# Patient Record
Sex: Female | Born: 1970 | Race: White | Hispanic: No | Marital: Single | Smoking: Never smoker
Health system: Southern US, Community
[De-identification: ages and names within clinical notes are randomized; demographics above are authoritative.]

## PROBLEM LIST (undated history)

## (undated) DIAGNOSIS — F329 Major depressive disorder, single episode, unspecified: Secondary | ICD-10-CM

## (undated) DIAGNOSIS — F419 Anxiety disorder, unspecified: Secondary | ICD-10-CM

## (undated) DIAGNOSIS — R102 Pelvic and perineal pain: Secondary | ICD-10-CM

## (undated) DIAGNOSIS — F32A Depression, unspecified: Secondary | ICD-10-CM

## (undated) HISTORY — PX: BREAST ENHANCEMENT SURGERY: SHX7

## (undated) HISTORY — PX: NASAL SINUS SURGERY: SHX719

## (undated) HISTORY — PX: ABDOMINAL HYSTERECTOMY: SHX81

---

## 2005-05-27 ENCOUNTER — Emergency Department: Payer: Self-pay | Admitting: Emergency Medicine

## 2005-11-06 HISTORY — PX: TUBAL LIGATION: SHX77

## 2008-08-17 ENCOUNTER — Ambulatory Visit: Payer: Self-pay | Admitting: Family Medicine

## 2009-11-13 ENCOUNTER — Ambulatory Visit: Payer: Self-pay | Admitting: Family Medicine

## 2009-11-21 ENCOUNTER — Ambulatory Visit: Payer: Self-pay | Admitting: Internal Medicine

## 2010-06-02 ENCOUNTER — Ambulatory Visit: Payer: Self-pay | Admitting: Internal Medicine

## 2010-08-25 ENCOUNTER — Emergency Department: Payer: Self-pay | Admitting: Emergency Medicine

## 2010-09-14 ENCOUNTER — Ambulatory Visit: Payer: Self-pay | Admitting: Family Medicine

## 2011-01-02 ENCOUNTER — Ambulatory Visit: Payer: Self-pay | Admitting: Internal Medicine

## 2011-12-28 ENCOUNTER — Ambulatory Visit: Payer: Self-pay | Admitting: Family Medicine

## 2012-01-02 ENCOUNTER — Ambulatory Visit: Payer: Self-pay | Admitting: Internal Medicine

## 2012-05-01 ENCOUNTER — Ambulatory Visit: Payer: Self-pay | Admitting: Family Medicine

## 2012-05-01 LAB — URINALYSIS, COMPLETE
Bilirubin,UR: NEGATIVE
Glucose,UR: NEGATIVE mg/dL (ref 0–75)
Nitrite: NEGATIVE
Protein: NEGATIVE

## 2012-05-01 LAB — PREGNANCY, URINE: Pregnancy Test, Urine: NEGATIVE m[IU]/mL

## 2012-05-02 LAB — URINE CULTURE

## 2012-07-21 ENCOUNTER — Ambulatory Visit: Payer: Self-pay

## 2013-06-24 ENCOUNTER — Ambulatory Visit: Payer: Self-pay | Admitting: Emergency Medicine

## 2013-06-24 LAB — URINALYSIS, COMPLETE
Glucose,UR: NEGATIVE mg/dL (ref 0–75)
Ketone: NEGATIVE
Nitrite: NEGATIVE
Specific Gravity: 1.02 (ref 1.003–1.030)

## 2013-09-16 ENCOUNTER — Ambulatory Visit: Payer: Self-pay | Admitting: Emergency Medicine

## 2013-09-16 LAB — URINALYSIS, COMPLETE

## 2013-09-18 LAB — URINE CULTURE

## 2013-11-21 ENCOUNTER — Encounter (HOSPITAL_BASED_OUTPATIENT_CLINIC_OR_DEPARTMENT_OTHER): Payer: Self-pay | Admitting: *Deleted

## 2013-11-21 NOTE — Progress Notes (Signed)
SPOKE W/ PT HUSBAND. ASK PT MED. ALLERGY REACTION, HUSBAND DID NOT KNOW AND IF TAKING MEDS, HE SAID SHE DID NOT. NPO AFTER MN. PRE-OP ORDERS PENDING. NEEDS HG.

## 2013-11-23 NOTE — H&P (Signed)
Keani Pupo  DICTATIOLorelle Gibbs # [id number] CSN# [id number]   Meriel PicaHOLLAND,Moe Brier M, MD 11/23/2013 9:32 AM

## 2013-11-24 ENCOUNTER — Encounter (HOSPITAL_COMMUNITY): Payer: Self-pay | Admitting: Pharmacist

## 2013-11-24 ENCOUNTER — Ambulatory Visit (HOSPITAL_BASED_OUTPATIENT_CLINIC_OR_DEPARTMENT_OTHER)
Admission: RE | Admit: 2013-11-24 | Discharge: 2013-11-24 | Disposition: A | Discharge status: Home / Self Care | Payer: Managed Care, Other (non HMO) | Source: Ambulatory Visit | Attending: Obstetrics and Gynecology | Admitting: Obstetrics and Gynecology

## 2013-11-24 ENCOUNTER — Encounter (HOSPITAL_BASED_OUTPATIENT_CLINIC_OR_DEPARTMENT_OTHER)
Admission: RE | Disposition: A | Discharge status: Home / Self Care | Payer: Self-pay | Source: Ambulatory Visit | Attending: Obstetrics and Gynecology

## 2013-11-24 ENCOUNTER — Ambulatory Visit (HOSPITAL_BASED_OUTPATIENT_CLINIC_OR_DEPARTMENT_OTHER): Payer: Managed Care, Other (non HMO) | Admitting: Anesthesiology

## 2013-11-24 ENCOUNTER — Encounter (HOSPITAL_BASED_OUTPATIENT_CLINIC_OR_DEPARTMENT_OTHER): Payer: Self-pay

## 2013-11-24 ENCOUNTER — Encounter (HOSPITAL_BASED_OUTPATIENT_CLINIC_OR_DEPARTMENT_OTHER): Payer: Managed Care, Other (non HMO) | Admitting: Anesthesiology

## 2013-11-24 DIAGNOSIS — N83209 Unspecified ovarian cyst, unspecified side: Secondary | ICD-10-CM | POA: Insufficient documentation

## 2013-11-24 DIAGNOSIS — N949 Unspecified condition associated with female genital organs and menstrual cycle: Secondary | ICD-10-CM | POA: Insufficient documentation

## 2013-11-24 DIAGNOSIS — N808 Other endometriosis: Secondary | ICD-10-CM | POA: Insufficient documentation

## 2013-11-24 DIAGNOSIS — N801 Endometriosis of ovary: Secondary | ICD-10-CM | POA: Insufficient documentation

## 2013-11-24 DIAGNOSIS — N80109 Endometriosis of ovary, unspecified side, unspecified depth: Secondary | ICD-10-CM | POA: Insufficient documentation

## 2013-11-24 DIAGNOSIS — N920 Excessive and frequent menstruation with regular cycle: Secondary | ICD-10-CM | POA: Insufficient documentation

## 2013-11-24 HISTORY — DX: Pelvic and perineal pain: R10.2

## 2013-11-24 HISTORY — PX: LAPAROSCOPY: SHX197

## 2013-11-24 LAB — POCT HEMOGLOBIN-HEMACUE: Hemoglobin: 13.2 g/dL (ref 12.0–15.0)

## 2013-11-24 SURGERY — LAPAROSCOPY, DIAGNOSTIC
Anesthesia: General | Site: Abdomen

## 2013-11-24 MED ORDER — OXYCODONE-ACETAMINOPHEN 5-325 MG PO TABS
1.0000 | ORAL_TABLET | ORAL | Status: DC | PRN
  Administered 2013-11-24: 1 via ORAL
  Filled 2013-11-24 (×2): qty 1

## 2013-11-24 MED ORDER — LIDOCAINE HCL (CARDIAC) 20 MG/ML IV SOLN
INTRAVENOUS | Status: DC | PRN
  Administered 2013-11-24: 80 mg via INTRAVENOUS

## 2013-11-24 MED ORDER — PROPOFOL 10 MG/ML IV BOLUS
INTRAVENOUS | Status: DC | PRN
  Administered 2013-11-24: 200 mg via INTRAVENOUS

## 2013-11-24 MED ORDER — ROCURONIUM BROMIDE 100 MG/10ML IV SOLN
INTRAVENOUS | Status: DC | PRN
  Administered 2013-11-24: 30 mg via INTRAVENOUS

## 2013-11-24 MED ORDER — MEPERIDINE HCL 25 MG/ML IJ SOLN
6.2500 mg | INTRAMUSCULAR | Status: DC | PRN
  Filled 2013-11-24: qty 1

## 2013-11-24 MED ORDER — LACTATED RINGERS IV SOLN
INTRAVENOUS | Status: DC
  Administered 2013-11-24: 07:00:00 via INTRAVENOUS
  Filled 2013-11-24: qty 1000

## 2013-11-24 MED ORDER — PROMETHAZINE HCL 25 MG/ML IJ SOLN
6.2500 mg | INTRAMUSCULAR | Status: DC | PRN
  Filled 2013-11-24: qty 1

## 2013-11-24 MED ORDER — HYDROMORPHONE HCL PF 1 MG/ML IJ SOLN
0.2500 mg | INTRAMUSCULAR | Status: DC | PRN
  Filled 2013-11-24: qty 1

## 2013-11-24 MED ORDER — FENTANYL CITRATE 0.05 MG/ML IJ SOLN
INTRAMUSCULAR | Status: DC | PRN
  Administered 2013-11-24: 25 ug via INTRAVENOUS
  Administered 2013-11-24: 50 ug via INTRAVENOUS
  Administered 2013-11-24: 25 ug via INTRAVENOUS
  Administered 2013-11-24: 50 ug via INTRAVENOUS
  Administered 2013-11-24 (×2): 25 ug via INTRAVENOUS

## 2013-11-24 MED ORDER — KETOROLAC TROMETHAMINE 30 MG/ML IJ SOLN
INTRAMUSCULAR | Status: DC | PRN
  Administered 2013-11-24: 30 mg via INTRAVENOUS

## 2013-11-24 MED ORDER — MIDAZOLAM HCL 2 MG/2ML IJ SOLN
INTRAMUSCULAR | Status: AC
  Filled 2013-11-24: qty 2

## 2013-11-24 MED ORDER — BUPIVACAINE HCL (PF) 0.25 % IJ SOLN
INTRAMUSCULAR | Status: DC | PRN
  Administered 2013-11-24: 6 mL

## 2013-11-24 MED ORDER — ONDANSETRON HCL 4 MG/2ML IJ SOLN
INTRAMUSCULAR | Status: DC | PRN
  Administered 2013-11-24: 4 mg via INTRAVENOUS

## 2013-11-24 MED ORDER — MIDAZOLAM HCL 5 MG/5ML IJ SOLN
INTRAMUSCULAR | Status: DC | PRN
  Administered 2013-11-24 (×2): 1 mg via INTRAVENOUS

## 2013-11-24 MED ORDER — GLYCOPYRROLATE 0.2 MG/ML IJ SOLN
INTRAMUSCULAR | Status: DC | PRN
  Administered 2013-11-24: 0.6 mg via INTRAVENOUS

## 2013-11-24 MED ORDER — NEOSTIGMINE METHYLSULFATE 1 MG/ML IJ SOLN
INTRAMUSCULAR | Status: DC | PRN
  Administered 2013-11-24: 4 mg via INTRAVENOUS

## 2013-11-24 MED ORDER — FENTANYL CITRATE 0.05 MG/ML IJ SOLN
INTRAMUSCULAR | Status: AC
  Filled 2013-11-24: qty 6

## 2013-11-24 MED ORDER — LACTATED RINGERS IV SOLN
INTRAVENOUS | Status: DC | PRN
  Administered 2013-11-24 (×2): via INTRAVENOUS

## 2013-11-24 MED ORDER — DEXAMETHASONE SODIUM PHOSPHATE 4 MG/ML IJ SOLN
INTRAMUSCULAR | Status: DC | PRN
  Administered 2013-11-24: 10 mg via INTRAVENOUS

## 2013-11-24 SURGICAL SUPPLY — 64 items
APPLICATOR COTTON TIP 6IN STRL (MISCELLANEOUS) ×3 IMPLANT
BAG URINE DRAINAGE (UROLOGICAL SUPPLIES) IMPLANT
BANDAGE ADHESIVE 1X3 (GAUZE/BANDAGES/DRESSINGS) ×3 IMPLANT
BLADE SURG 11 STRL SS (BLADE) ×3 IMPLANT
BLADE SURG ROTATE 9660 (MISCELLANEOUS) IMPLANT
CANISTER SUCTION 2500CC (MISCELLANEOUS) IMPLANT
CATH FOLEY 2WAY SLVR  5CC 16FR (CATHETERS)
CATH FOLEY 2WAY SLVR 5CC 16FR (CATHETERS) IMPLANT
CATH ROBINSON RED A/P 16FR (CATHETERS) IMPLANT
CLOSURE WOUND 1/4X4 (GAUZE/BANDAGES/DRESSINGS)
CLOTH BEACON ORANGE TIMEOUT ST (SAFETY) ×3 IMPLANT
DERMABOND ADVANCED (GAUZE/BANDAGES/DRESSINGS) ×2
DERMABOND ADVANCED .7 DNX12 (GAUZE/BANDAGES/DRESSINGS) ×1 IMPLANT
DRAPE CAMERA CLOSED 9X96 (DRAPES) ×3 IMPLANT
DRAPE UNDERBUTTOCKS STRL (DRAPE) ×3 IMPLANT
DRESSING TELFA 8X3 (GAUZE/BANDAGES/DRESSINGS) IMPLANT
ELECT REM PT RETURN 9FT ADLT (ELECTROSURGICAL) ×3
ELECTRODE REM PT RTRN 9FT ADLT (ELECTROSURGICAL) ×1 IMPLANT
FILTER SMOKE EVAC LAPAROSHD (FILTER) IMPLANT
GLOVE BIO SURGEON STRL SZ7 (GLOVE) ×6 IMPLANT
GLOVE BIOGEL M 6.5 STRL (GLOVE) ×12 IMPLANT
GLOVE BIOGEL M STER SZ 6 (GLOVE) ×3 IMPLANT
GLOVE BIOGEL PI IND STRL 6.5 (GLOVE) ×1 IMPLANT
GLOVE BIOGEL PI INDICATOR 6.5 (GLOVE) ×2
GOWN PREVENTION PLUS LG XLONG (DISPOSABLE) IMPLANT
GOWN STRL REUS W/TWL LRG LVL3 (GOWN DISPOSABLE) ×9 IMPLANT
GOWN STRL REUS W/TWL XL LVL3 (GOWN DISPOSABLE) ×3 IMPLANT
IV LACTATED RINGERS 1000ML (IV SOLUTION) IMPLANT
NEEDLE HYPO 25X1 1.5 SAFETY (NEEDLE) IMPLANT
NEEDLE INSUFFLATION 14GA 120MM (NEEDLE) ×3 IMPLANT
NEEDLE INSUFFLATION 14GA 150MM (NEEDLE) IMPLANT
NS IRRIG 500ML POUR BTL (IV SOLUTION) ×3 IMPLANT
PACK BASIN DAY SURGERY FS (CUSTOM PROCEDURE TRAY) ×3 IMPLANT
PACK LAPAROSCOPY II (CUSTOM PROCEDURE TRAY) ×3 IMPLANT
PAD OB MATERNITY 4.3X12.25 (PERSONAL CARE ITEMS) ×3 IMPLANT
PAD PREP 24X48 CUFFED NSTRL (MISCELLANEOUS) ×3 IMPLANT
PENCIL BUTTON HOLSTER BLD 10FT (ELECTRODE) IMPLANT
POUCH SPECIMEN RETRIEVAL 10MM (ENDOMECHANICALS) IMPLANT
SCALPEL HARMONIC ACE (MISCELLANEOUS) IMPLANT
SCISSORS LAP 5X35 DISP (ENDOMECHANICALS) IMPLANT
SEALER TISSUE G2 CVD JAW 35 (ENDOMECHANICALS) IMPLANT
SEALER TISSUE G2 CVD JAW 45CM (ENDOMECHANICALS)
SET IRRIG TUBING LAPAROSCOPIC (IRRIGATION / IRRIGATOR) IMPLANT
SOLUTION ANTI FOG 6CC (MISCELLANEOUS) ×3 IMPLANT
SPONGE GAUZE 4X4 12PLY (GAUZE/BANDAGES/DRESSINGS) ×3 IMPLANT
STRIP CLOSURE SKIN 1/4X4 (GAUZE/BANDAGES/DRESSINGS) IMPLANT
SUT MNCRL AB 3-0 PS2 18 (SUTURE) ×3 IMPLANT
SUT MON AB 2-0 CT1 36 (SUTURE) IMPLANT
SUT PLAIN 4 0 FS 2 27 (SUTURE) IMPLANT
SUT VICRYL 0 TIES 12 18 (SUTURE) IMPLANT
SUT VICRYL 0 UR6 27IN ABS (SUTURE) IMPLANT
SUT VICRYL RAPIDE 3 0 (SUTURE) ×3 IMPLANT
SUT VICRYL RAPIDE 4/0 PS 2 (SUTURE) ×3 IMPLANT
SYR 3ML 23GX1 SAFETY (SYRINGE) IMPLANT
SYR CONTROL 10ML LL (SYRINGE) IMPLANT
SYRINGE 10CC LL (SYRINGE) ×3 IMPLANT
TOWEL OR 17X24 6PK STRL BLUE (TOWEL DISPOSABLE) ×6 IMPLANT
TRAY DSU PREP LF (CUSTOM PROCEDURE TRAY) ×3 IMPLANT
TROCAR OPTI TIP 5M 100M (ENDOMECHANICALS) ×6 IMPLANT
TROCAR XCEL BLUNT TIP 100MML (ENDOMECHANICALS) IMPLANT
TROCAR XCEL DIL TIP R 11M (ENDOMECHANICALS) ×3 IMPLANT
TUBING INSUFFLATION W/FILTER (TUBING) ×3 IMPLANT
VACUUM HOSE/TUBING 7/8INX6FT (MISCELLANEOUS) IMPLANT
WATER STERILE IRR 500ML POUR (IV SOLUTION) ×3 IMPLANT

## 2013-11-24 NOTE — Op Note (Signed)
Preoperative diagnosis: Pelvic pain, menorrhagia  Postoperative diagnosis: Same, plus pelvic endometriosis  Procedure: Diagnostic laparoscopy, lysis of adhesions  Surgeon: Monica Osborne  Anesthesia: Gen.  EBL: Less than 5 cc  Procedure and findings:  The patient taken the operating room after an adequate level of general anesthesia was obtained with the patient's legs in stirrups, the abdomen perineum and vagina were prepped and draped in the usual fashion. The bladder was drained. Appropriate timeout was taken at that point. EUA was carried out the uterus is normal size, mobile, adnexa negative. Hulka tenaculum was positioned.  Attention directed to the abdomen, the subumbilical area was infiltrated with quarter percent Marcaine plain, small incision was made in the varies needle was introduced without difficulty. Its intra-abdominal position was verified by pressure water testing. After 2 L pneumoperitoneum syncopated lap scopic trocar and sleeve were then introduced without difficulty. Initial inspection revealed no evidence of any bleeding or trauma, 3 finger breaths above the symphysis in the midline, a 5 mm trocar was inserted under direct visualization. The patient was then placed in Trendelenburg and the pelvic findings as follows  Uterus itself was normal size, the anterior cul-de-sac spaces free and clear there were several superficial implants of endometriosis noted on the surface of the right ovary, on the left side there was a very small 1 cm clear wall cyst and several periadnexal adhesions which were lysed in an avascular plane to allow elevation of the left ovary. This did show some minimal scarring along the left pelvic sidewall consistent with endometriosis. In the cul-de-sac there was a peritoneal window lateral to the right uterosacral ligament and again consistent with pelvic endometriosis. No other upper abdominal abnormalities were noted these findings for photo documented. Its was  removed, gas allowed to escape because closed with 4-0 Vicryl subcuticular suture and Dermabond. Prior to closure sponge, needle, instrument counts reported as correct x2. She tolerated this well went to recovery in good condition.  Dictated with [facility]  Monica Osborne M. Monica Osborne M.D.

## 2013-11-24 NOTE — Anesthesia Postprocedure Evaluation (Signed)
Anesthesia Post Note  Patient: [name]  Procedure(s) Performed: Procedure(s) (LRB): LAPAROSCOPY DIAGNOSTIC (N/A)  Anesthesia type: General  Patient location: PACU  Post pain: Pain level controlled  Post assessment: Post-op Vital signs reviewed  Last Vitals: BP 109/67  Pulse 57  Temp(Src) 36.9 C (Oral)  Resp 12  Ht 5\' 8"  (1.727 m)  Wt 154 lb (69.854 kg)  BMI 23.42 kg/m2  SpO2 98%  LMP 11/19/2013  Post vital signs: Reviewed  Level of consciousness: sedated  Complications: No apparent anesthesia complications

## 2013-11-24 NOTE — Anesthesia Procedure Notes (Signed)
Procedure Name: Intubation Date/Time: 11/24/2013 7:26 AM Performed by: Jessica PriestBEESON, Kamalani Mastro C Pre-anesthesia Checklist: Patient identified, Emergency Drugs available, Suction available and Patient being monitored Patient Re-evaluated:Patient Re-evaluated prior to inductionOxygen Delivery Method: Circle System Utilized Preoxygenation: Pre-oxygenation with 100% oxygen Intubation Type: IV induction Ventilation: Mask ventilation without difficulty Laryngoscope Size: Mac and 4 Grade View: Grade II Tube type: Oral Tube size: 7.0 mm Number of attempts: 1 Airway Equipment and Method: stylet and oral airway Placement Confirmation: ETT inserted through vocal cords under direct vision,  positive ETCO2 and breath sounds checked- equal and bilateral Tube secured with: Tape Dental Injury: Teeth and Oropharynx as per pre-operative assessment

## 2013-11-24 NOTE — Transfer of Care (Signed)
Immediate Anesthesia Transfer of Care Note  Patient: Monica Osborne  Procedure(s) Performed: Procedure(s) (LRB): LAPAROSCOPY DIAGNOSTIC (N/A)  Patient Location: PACU  Anesthesia Type: General  Level of Consciousness: awake, sedated, patient cooperative and responds to stimulation  Airway & Oxygen Therapy: Patient Spontanous Breathing and Patient connected to face mask oxygen  Post-op Assessment: Report given to PACU RN, Post -op Vital signs reviewed and stable and Patient moving all extremities  Post vital signs: Reviewed and stable  Complications: No apparent anesthesia complications

## 2013-11-24 NOTE — Discharge Instructions (Addendum)
Post Anesthesia Home Care Instructions  Activity: Get plenty of rest for the remainder of the day. A responsible adult should stay with you for 24 hours following the procedure.  For the next 24 hours, DO NOT: -Drive a car -Advertising copywriterperate machinery -Drink alcoholic beverages -Take any medication unless instructed by your physician -Make any legal decisions or sign important papers.  Meals:  Start with liquid foods such as gelatin or soup. Progress to regular foods as tolerated. Avoid greasy, spicy, heavy foods. If nausea and/or vomiting occur, drink only clear liquids until the nausea and/or vomiting subsides. Call your physician if vomiting continues.lent) drainage from any of the wounds.  You are unable to pass gas or have a bowel movement.  You feel sick to your stomach (nauseous) or throw up (vomit). MAKE SURE YOU:   Understand these instructions.  Will watch your condition.  Will get help right away if you are not doing well or get worse. Document Released: 01/29/2001 Document Revised: 02/17/2013 Document Reviewed: 10/23/2007 [facility] Patient Information 2014 [place].  Post Anesthesia Home Care Instructions  Activity: Get plenty of rest for the remainder of the day. A responsible adult should stay with you for 24 hours following the procedure.  For the next 24 hours, DO NOT: -Drive a car -Advertising copywriterperate machinery -Drink alcoholic beverages -Take any medication unless instructed by your physician -Make any legal decisions or sign important papers.  Meals: Start with liquid foods such as gelatin or soup. Progress to regular foods as tolerated. Avoid greasy, spicy, heavy foods. If nausea and/or vomiting occur, drink only clear liquids until the nausea and/or vomiting subsides. Call your physician if vomiting continues.  Special Instructions/Symptoms: Your throat may feel dry or sore from the anesthesia or the breathing tube placed in your throat during surgery. If this causes  discomfort, gargle with warm salt water. The discomfort should disappear within 24 hours. Diagnostic Laparoscopy Laparoscopy is a surgical procedure. It is used to diagnose and treat diseases inside the belly (abdomen). It is usually a brief, common, and relatively simple procedure. The laparoscopeis a thin, lighted, pencil-sized instrument. It is like a telescope. It is inserted into your abdomen through a small cut (incision). Your caregiver can look at the organs inside your body through this instrument. He or she can see if there is anything abnormal. Laparoscopy can be done either in a hospital or outpatient clinic. You may be given a mild sedative to help you relax before the procedure. Once in the operating room, you will be given a drug to make you sleep (general anesthesia). Laparoscopy usually lasts less than 1 hour. After the procedure, you will be monitored in a recovery area until you are stable and doing well. Once you are home, it will take 2 to 3 days to fully recover. RISKS AND COMPLICATIONS  Laparoscopy has relatively few risks. Your caregiver will discuss the risks with you before the procedure. Some problems that can occur include:  Infection.  Bleeding.  Damage to other organs.  Anesthetic side effects. PROCEDURE Once you receive anesthesia, your surgeon inflates the abdomen with a harmless gas (carbon dioxide). This makes the organs easier to see. The laparoscope is inserted into the abdomen through a small incision. This allows your surgeon to see into the abdomen. Other small instruments are also inserted into the abdomen through other small openings. Many surgeons attach a video camera to the laparoscope to enlarge the view. During a diagnostic laparoscopy, the surgeon may be looking for inflammation,  infection, or cancer. Your surgeon may take tissue samples(biopsies). The samples are sent to a specialist in looking at cells and tissue samples (pathologist). The pathologist  examines them under a microscope. Biopsies can help to diagnose or confirm a disease. AFTER THE PROCEDURE   The gas is released from inside the abdomen.  The incisions are closed with stitches (sutures). Because these incisions are small (usually less than 1/2 inch), there is usually minimal discomfort after the procedure. There may be some mild discomfort in the throat. This is from the tube placed in the throat while you were sleeping. You may have some mild abdominal discomfort. There may also be discomfort from the instrument placement incisions in the abdomen.  The recovery time is shortened as long as there are no complications.  You will rest in a recovery room until stable and doing well. As long as there are no complications, you may be allowed to go home. FINDING OUT THE RESULTS OF YOUR TEST Not all test results are available during your visit. If your test results are not back during the visit, make an appointment with your caregiver to find out the results. Do not assume everything is normal if you have not heard from your caregiver or the medical facility. It is important for you to follow up on all of your test results. HOME CARE INSTRUCTIONS   Take all medicines as directed.  Only take over-the-counter or prescription medicines for pain, discomfort, or fever as directed by your caregiver.  Resume daily activities as directed.  Showers are preferred over baths.  You may resume sexual activities in 1 week or as directed.  Do not drive while taking narcotics. SEEK MEDICAL CARE IF:   There is increasing abdominal pain.  There is new pain in the shoulders (shoulder strap areas).  You feel lightheaded or faint.  You have the chills.  You or your child has an oral temperature above 102 F (38.9 C).  There is pus-like (purulent) drainage from any of the wounds.  You are unable to pass gas or have a bowel movement.  You feel sick to your stomach (nauseous) or throw up  (vomit). MAKE SURE YOU:   Understand these instructions.  Will watch your condition.  Will get help right away if you are not doing well or get worse. Document Released: 01/29/2001 Document Revised: 02/17/2013 Document Reviewed: 10/23/2007 [facility] Patient Information 2014 [place], [place].

## 2013-11-24 NOTE — Anesthesia Preprocedure Evaluation (Signed)
Anesthesia Evaluation  Patient identified by MRN, date of birth, ID band Patient awake    Reviewed: Allergy & Precautions, H&P , NPO status , Patient's Chart, lab work & pertinent test results  Airway Mallampati: I TM Distance: >3 FB Neck ROM: Full    Dental  (+) Dental Advisory Given and Teeth Intact   Pulmonary neg pulmonary ROS,  breath sounds clear to auscultation        Cardiovascular negative cardio ROS  Rhythm:Regular Rate:Normal     Neuro/Psych negative neurological ROS  negative psych ROS   GI/Hepatic negative GI ROS, Neg liver ROS,   Endo/Other  negative endocrine ROS  Renal/GU negative Renal ROS     Musculoskeletal negative musculoskeletal ROS (+)   Abdominal   Peds  Hematology negative hematology ROS (+)   Anesthesia Other Findings   Reproductive/Obstetrics negative OB ROS                           Anesthesia Physical Anesthesia Plan  ASA: I  Anesthesia Plan: General   Post-op Pain Management:    Induction: Intravenous  Airway Management Planned: Oral ETT  Additional Equipment:   Intra-op Plan:   Post-operative Plan: Extubation in OR  Informed Consent: I have reviewed the patients History and Physical, chart, labs and discussed the procedure including the risks, benefits and alternatives for the proposed anesthesia with the patient or authorized representative who has indicated his/her understanding and acceptance.   Dental advisory given  Plan Discussed with: CRNA  Anesthesia Plan Comments:         Anesthesia Quick Evaluation

## 2013-11-24 NOTE — Progress Notes (Signed)
The patient was re-examined with no change in status 

## 2013-11-25 NOTE — H&P (Signed)
NAMPhilmore Pali:  Osborne, Monica            ACCOUNT NO.:  [id number]  MEDICAL RECORD NO.:  [id number]  LOCATION:                                 FACILITY:  PHYSICIAN:  Duke Salviaichard M. Marcelle OverlieHolland, M.D.    DATE OF BIRTH:  DATE OF ADMISSION:  11/24/2013 DATE OF DISCHARGE:                             HISTORY & PHYSICAL   CHIEF COMPLAINT:  Pelvic pain.  HISTORY OF PRESENT ILLNESS:  A 43 year old, G1, P1, __________ recently complaining of several month history of worsening bilateral pelvic pain, heavier menstrual periods and deep dyspareunia.  Prior to Christmas, she was seen in [place] on 2 different occasions, had ultrasound and CT done, was told that she had a small ovarian cyst and was put on meloxicam for pain.  Unfortunately, her pain continued to be more severe, bilateral lower quadrant and deep pelvic pain.  Because of the __________ imaging has already been done and severity of the pain, she presents now for diagnostic laparoscopy.  This procedure including specific risks related to bleeding, infection, adjacent organ injury, possible need for additional surgery reviewed with her which she understands and accepts.  PAST MEDICAL HISTORY:  She has had 1 vaginal delivery, prior tubal__________, no other history of abdominal surgery.  CURRENT MEDICATIONS:  Percocet.  PHYSICAL EXAMINATION:  VITAL SIGNS:  Temperature 98.2, blood pressure 120/72. HEENT:  Unremarkable. NECK:  Supple without masses. LUNGS:  Clear. CARDIOVASCULAR:  Regular rate and rhythm without murmurs, rubs, gallops noted. BREASTS:  Without masses. ABDOMEN:  Soft, flat.  Some tenderness in the lower quadrants but no rebound.  Vulva, vagina, cervix normal.  Uterus was mid positioned, normal size, tender to manipulation, bilateral discomfort on palpation. No definite mass noted. EXTREMITIES:  Unremarkable. NEUROLOGIC:  Unremarkable.  IMPRESSION:  Acute on chronic pelvic pain.  Possibilities include chronic pelvic  inflammatory disease, pelvic endometriosis, or other etiologies.  PLAN:  Diagnostic laparoscopy.  Procedure and risks discussed as above.     Monica Osborne M. Marcelle OverlieHolland, M.D.     [facility]/MEDQ  D:  11/23/2013  T:  11/23/2013  Job:  [id number]

## 2013-11-26 ENCOUNTER — Encounter (HOSPITAL_BASED_OUTPATIENT_CLINIC_OR_DEPARTMENT_OTHER): Payer: Self-pay | Admitting: Obstetrics and Gynecology

## 2013-12-01 ENCOUNTER — Encounter (HOSPITAL_COMMUNITY)
Admission: RE | Admit: 2013-12-01 | Discharge: 2013-12-01 | Disposition: A | Discharge status: Home / Self Care | Payer: Managed Care, Other (non HMO) | Source: Ambulatory Visit | Attending: Obstetrics and Gynecology | Admitting: Obstetrics and Gynecology

## 2013-12-01 ENCOUNTER — Encounter (HOSPITAL_COMMUNITY): Payer: Self-pay

## 2013-12-01 MED ORDER — DEXTROSE 5 % IV SOLN
2.0000 g | INTRAVENOUS | Status: AC
  Administered 2013-12-02: 2 g via INTRAVENOUS
  Filled 2013-12-01: qty 2

## 2013-12-01 NOTE — Progress Notes (Signed)
I have reviewed DL findings with pt + husband. Pt prefers LAVH/BSO...discussed specific risks related to bleeding, infxn, adj organ injury, poss need to complete surgery via open technique, phlebitis, wound infxn and ERT.   ROS signif for breast aug, CS X 1 She is married, denies tobacc or drug use, drinks ETOH socially  Dr Reola MosherEliz White is her PCP

## 2013-12-01 NOTE — Patient Instructions (Addendum)
   Your procedure is scheduled on: Tuesday, Jan 27  [facility]  6 AM Pick up the phone at the desk and dial [phone] and inform us of your arrival.  Please call this number if you have any problems the morning of surgery: [phone]  Remember: Do not eat or drink after midnight:  Monday Take these medicines the morning of surgery with a SIP OF WATER: None   Do not wear jewelry, make-up, or FINGER nail polish No metal in your hair or on your body. Do not wear lotions, powders, perfumes.  You may wear deodorant.  Do not bring valuables to the hospital. Contacts, dentures or bridgework may not be worn into surgery.  Leave suitcase in the car. After Surgery it may be brought to your room. For patients being admitted to the hospital, checkout time is 11:00am the day of discharge.

## 2013-12-02 ENCOUNTER — Ambulatory Visit (HOSPITAL_COMMUNITY): Payer: Managed Care, Other (non HMO) | Admitting: Anesthesiology

## 2013-12-02 ENCOUNTER — Encounter (HOSPITAL_COMMUNITY): Payer: Self-pay | Admitting: *Deleted

## 2013-12-02 ENCOUNTER — Encounter (HOSPITAL_COMMUNITY)
Admission: RE | Disposition: A | Discharge status: Home / Self Care | Payer: Self-pay | Source: Ambulatory Visit | Attending: Obstetrics and Gynecology

## 2013-12-02 ENCOUNTER — Encounter (HOSPITAL_COMMUNITY): Payer: Managed Care, Other (non HMO) | Admitting: Anesthesiology

## 2013-12-02 ENCOUNTER — Observation Stay (HOSPITAL_COMMUNITY)
Admission: RE | Admit: 2013-12-02 | Discharge: 2013-12-03 | Disposition: A | Discharge status: Home / Self Care | Payer: Managed Care, Other (non HMO) | Source: Ambulatory Visit | Attending: Obstetrics and Gynecology | Admitting: Obstetrics and Gynecology

## 2013-12-02 DIAGNOSIS — R102 Pelvic and perineal pain: Secondary | ICD-10-CM | POA: Diagnosis present

## 2013-12-02 DIAGNOSIS — N949 Unspecified condition associated with female genital organs and menstrual cycle: Secondary | ICD-10-CM | POA: Insufficient documentation

## 2013-12-02 DIAGNOSIS — N838 Other noninflammatory disorders of ovary, fallopian tube and broad ligament: Secondary | ICD-10-CM | POA: Insufficient documentation

## 2013-12-02 DIAGNOSIS — N831 Corpus luteum cyst of ovary, unspecified side: Secondary | ICD-10-CM | POA: Insufficient documentation

## 2013-12-02 DIAGNOSIS — N809 Endometriosis, unspecified: Secondary | ICD-10-CM

## 2013-12-02 DIAGNOSIS — G8929 Other chronic pain: Principal | ICD-10-CM | POA: Insufficient documentation

## 2013-12-02 DIAGNOSIS — N803 Endometriosis of pelvic peritoneum, unspecified: Secondary | ICD-10-CM | POA: Insufficient documentation

## 2013-12-02 HISTORY — PX: LAPAROSCOPIC ASSISTED VAGINAL HYSTERECTOMY: SHX5398

## 2013-12-02 HISTORY — PX: SALPINGOOPHORECTOMY: SHX82

## 2013-12-02 LAB — CBC
HCT: 40.2 % (ref 36.0–46.0)
Hemoglobin: 13.8 g/dL (ref 12.0–15.0)
MCH: 34.2 pg — ABNORMAL HIGH (ref 26.0–34.0)
MCHC: 34.3 g/dL (ref 30.0–36.0)
MCV: 99.5 fL (ref 78.0–100.0)
PLATELETS: 191 10*3/uL (ref 150–400)
RBC: 4.04 MIL/uL (ref 3.87–5.11)
RDW: 13 % (ref 11.5–15.5)
WBC: 5.6 10*3/uL (ref 4.0–10.5)

## 2013-12-02 LAB — TYPE AND SCREEN
ABO/RH(D): A NEG
Antibody Screen: NEGATIVE

## 2013-12-02 LAB — ABO/RH: ABO/RH(D): A NEG

## 2013-12-02 LAB — PREGNANCY, URINE: PREG TEST UR: NEGATIVE

## 2013-12-02 SURGERY — HYSTERECTOMY, VAGINAL, LAPAROSCOPY-ASSISTED
Anesthesia: General

## 2013-12-02 MED ORDER — NEOSTIGMINE METHYLSULFATE 1 MG/ML IJ SOLN
INTRAMUSCULAR | Status: AC
  Filled 2013-12-02: qty 1

## 2013-12-02 MED ORDER — FENTANYL CITRATE 0.05 MG/ML IJ SOLN
INTRAMUSCULAR | Status: AC
  Administered 2013-12-02: 50 ug via INTRAVENOUS
  Filled 2013-12-02: qty 2

## 2013-12-02 MED ORDER — DIPHENHYDRAMINE HCL 12.5 MG/5ML PO ELIX
12.5000 mg | ORAL_SOLUTION | Freq: Four times a day (QID) | ORAL | Status: DC | PRN
  Administered 2013-12-02 (×2): 12.5 mg via ORAL
  Filled 2013-12-02 (×2): qty 5

## 2013-12-02 MED ORDER — SODIUM CHLORIDE 0.9 % IJ SOLN
INTRAMUSCULAR | Status: AC
  Filled 2013-12-02: qty 10

## 2013-12-02 MED ORDER — MIDAZOLAM HCL 2 MG/2ML IJ SOLN
INTRAMUSCULAR | Status: DC | PRN
  Administered 2013-12-02: 2 mg via INTRAVENOUS

## 2013-12-02 MED ORDER — FENTANYL CITRATE 0.05 MG/ML IJ SOLN
25.0000 ug | INTRAMUSCULAR | Status: DC | PRN
  Administered 2013-12-02 (×2): 50 ug via INTRAVENOUS

## 2013-12-02 MED ORDER — LACTATED RINGERS IV SOLN
INTRAVENOUS | Status: DC
  Administered 2013-12-02 (×2): via INTRAVENOUS

## 2013-12-02 MED ORDER — ONDANSETRON HCL 4 MG/2ML IJ SOLN
INTRAMUSCULAR | Status: DC | PRN
  Administered 2013-12-02: 4 mg via INTRAVENOUS

## 2013-12-02 MED ORDER — MEPERIDINE HCL 25 MG/ML IJ SOLN
6.2500 mg | INTRAMUSCULAR | Status: DC | PRN
  Administered 2013-12-02: 12.5 mg via INTRAVENOUS

## 2013-12-02 MED ORDER — GLYCOPYRROLATE 0.2 MG/ML IJ SOLN
INTRAMUSCULAR | Status: AC
  Filled 2013-12-02: qty 1

## 2013-12-02 MED ORDER — NEOSTIGMINE METHYLSULFATE 1 MG/ML IJ SOLN
INTRAMUSCULAR | Status: DC | PRN
  Administered 2013-12-02: 3 mg via INTRAVENOUS

## 2013-12-02 MED ORDER — NALOXONE HCL 0.4 MG/ML IJ SOLN: 0.4000 mg | INTRAMUSCULAR | Status: DC | PRN

## 2013-12-02 MED ORDER — FENTANYL CITRATE 0.05 MG/ML IJ SOLN
INTRAMUSCULAR | Status: AC
  Filled 2013-12-02: qty 2

## 2013-12-02 MED ORDER — ONDANSETRON HCL 4 MG/2ML IJ SOLN: 4.0000 mg | Freq: Four times a day (QID) | INTRAMUSCULAR | Status: DC | PRN

## 2013-12-02 MED ORDER — FENTANYL CITRATE 0.05 MG/ML IJ SOLN
INTRAMUSCULAR | Status: AC
  Filled 2013-12-02: qty 5

## 2013-12-02 MED ORDER — IBUPROFEN 800 MG PO TABS
800.0000 mg | ORAL_TABLET | Freq: Three times a day (TID) | ORAL | Status: DC | PRN
  Administered 2013-12-03: 800 mg via ORAL
  Filled 2013-12-02: qty 1

## 2013-12-02 MED ORDER — ONDANSETRON HCL 4 MG/2ML IJ SOLN
INTRAMUSCULAR | Status: AC
  Filled 2013-12-02: qty 2

## 2013-12-02 MED ORDER — DEXTROSE IN LACTATED RINGERS 5 % IV SOLN
INTRAVENOUS | Status: DC
  Administered 2013-12-02 (×2): via INTRAVENOUS

## 2013-12-02 MED ORDER — HYDROMORPHONE HCL PF 1 MG/ML IJ SOLN
INTRAMUSCULAR | Status: AC
  Administered 2013-12-02: 0.5 mg via INTRAVENOUS
  Filled 2013-12-02: qty 1

## 2013-12-02 MED ORDER — FENTANYL CITRATE 0.05 MG/ML IJ SOLN
INTRAMUSCULAR | Status: DC | PRN
  Administered 2013-12-02 (×4): 50 ug via INTRAVENOUS
  Administered 2013-12-02 (×2): 100 ug via INTRAVENOUS
  Administered 2013-12-02: 50 ug via INTRAVENOUS

## 2013-12-02 MED ORDER — ROCURONIUM BROMIDE 100 MG/10ML IV SOLN
INTRAVENOUS | Status: AC
  Filled 2013-12-02: qty 1

## 2013-12-02 MED ORDER — BUPIVACAINE HCL (PF) 0.25 % IJ SOLN
INTRAMUSCULAR | Status: DC | PRN
  Administered 2013-12-02: 5 mL

## 2013-12-02 MED ORDER — GLYCOPYRROLATE 0.2 MG/ML IJ SOLN
INTRAMUSCULAR | Status: DC | PRN
  Administered 2013-12-02: 0.6 mg via INTRAVENOUS

## 2013-12-02 MED ORDER — PROMETHAZINE HCL 25 MG/ML IJ SOLN: 6.2500 mg | INTRAMUSCULAR | Status: DC | PRN

## 2013-12-02 MED ORDER — KETOROLAC TROMETHAMINE 30 MG/ML IJ SOLN
30.0000 mg | Freq: Four times a day (QID) | INTRAMUSCULAR | Status: DC
  Administered 2013-12-02 (×2): 30 mg via INTRAVENOUS
  Filled 2013-12-02 (×2): qty 1

## 2013-12-02 MED ORDER — OXYCODONE-ACETAMINOPHEN 5-325 MG PO TABS
1.0000 | ORAL_TABLET | ORAL | Status: DC | PRN
  Administered 2013-12-02 – 2013-12-03 (×3): 2 via ORAL
  Filled 2013-12-02 (×3): qty 2

## 2013-12-02 MED ORDER — BUPIVACAINE HCL (PF) 0.25 % IJ SOLN
INTRAMUSCULAR | Status: AC
  Filled 2013-12-02: qty 30

## 2013-12-02 MED ORDER — MIDAZOLAM HCL 2 MG/2ML IJ SOLN
INTRAMUSCULAR | Status: AC
  Filled 2013-12-02: qty 2

## 2013-12-02 MED ORDER — KETOROLAC TROMETHAMINE 30 MG/ML IJ SOLN: 15.0000 mg | Freq: Once | INTRAMUSCULAR | Status: DC | PRN

## 2013-12-02 MED ORDER — ZOLPIDEM TARTRATE 5 MG PO TABS: 5.0000 mg | ORAL_TABLET | Freq: Every evening | ORAL | Status: DC | PRN

## 2013-12-02 MED ORDER — KETOROLAC TROMETHAMINE 30 MG/ML IJ SOLN: 30.0000 mg | Freq: Four times a day (QID) | INTRAMUSCULAR | Status: DC

## 2013-12-02 MED ORDER — KETOROLAC TROMETHAMINE 30 MG/ML IJ SOLN
INTRAMUSCULAR | Status: AC
  Filled 2013-12-02: qty 1

## 2013-12-02 MED ORDER — MENTHOL 3 MG MT LOZG: 1.0000 | LOZENGE | OROMUCOSAL | Status: DC | PRN

## 2013-12-02 MED ORDER — DIPHENHYDRAMINE HCL 50 MG/ML IJ SOLN: 12.5000 mg | Freq: Four times a day (QID) | INTRAMUSCULAR | Status: DC | PRN

## 2013-12-02 MED ORDER — HYDROMORPHONE HCL PF 1 MG/ML IJ SOLN
0.2500 mg | INTRAMUSCULAR | Status: DC | PRN
  Administered 2013-12-02 (×4): 0.5 mg via INTRAVENOUS

## 2013-12-02 MED ORDER — KETOROLAC TROMETHAMINE 30 MG/ML IJ SOLN: 30.0000 mg | Freq: Once | INTRAMUSCULAR | Status: DC

## 2013-12-02 MED ORDER — DEXAMETHASONE SODIUM PHOSPHATE 10 MG/ML IJ SOLN
INTRAMUSCULAR | Status: AC
  Filled 2013-12-02: qty 1

## 2013-12-02 MED ORDER — LACTATED RINGERS IR SOLN
Status: DC | PRN
  Administered 2013-12-02: 3000 mL

## 2013-12-02 MED ORDER — ROCURONIUM BROMIDE 100 MG/10ML IV SOLN
INTRAVENOUS | Status: DC | PRN
  Administered 2013-12-02: 40 mg via INTRAVENOUS

## 2013-12-02 MED ORDER — BUTORPHANOL TARTRATE 1 MG/ML IJ SOLN: 1.0000 mg | INTRAMUSCULAR | Status: DC | PRN

## 2013-12-02 MED ORDER — SODIUM CHLORIDE 0.9 % IJ SOLN: 9.0000 mL | INTRAMUSCULAR | Status: DC | PRN

## 2013-12-02 MED ORDER — HYDROMORPHONE 0.3 MG/ML IV SOLN
INTRAVENOUS | Status: DC
  Administered 2013-12-02: 0.3 mL via INTRAVENOUS
  Administered 2013-12-02: 18:00:00 via INTRAVENOUS
  Administered 2013-12-02: 8 mL via INTRAVENOUS
  Administered 2013-12-02: 16 mL via INTRAVENOUS
  Administered 2013-12-02: 2.39 mg via INTRAVENOUS
  Filled 2013-12-02 (×2): qty 25

## 2013-12-02 MED ORDER — KETOROLAC TROMETHAMINE 30 MG/ML IJ SOLN
INTRAMUSCULAR | Status: DC | PRN
  Administered 2013-12-02: 30 mg via INTRAVENOUS

## 2013-12-02 MED ORDER — DEXAMETHASONE SODIUM PHOSPHATE 10 MG/ML IJ SOLN
INTRAMUSCULAR | Status: DC | PRN
  Administered 2013-12-02: 10 mg via INTRAVENOUS

## 2013-12-02 MED ORDER — MEPERIDINE HCL 25 MG/ML IJ SOLN
INTRAMUSCULAR | Status: AC
  Filled 2013-12-02: qty 1

## 2013-12-02 SURGICAL SUPPLY — 39 items
CABLE HIGH FREQUENCY MONO STRZ (ELECTRODE) IMPLANT
CATH ROBINSON RED A/P 16FR (CATHETERS) IMPLANT
CLOTH BEACON ORANGE TIMEOUT ST (SAFETY) ×4 IMPLANT
CONT PATH 16OZ SNAP LID 3702 (MISCELLANEOUS) ×4 IMPLANT
COVER TABLE BACK 60X90 (DRAPES) ×4 IMPLANT
DECANTER SPIKE VIAL GLASS SM (MISCELLANEOUS) ×4 IMPLANT
DERMABOND ADVANCED (GAUZE/BANDAGES/DRESSINGS) ×4
DERMABOND ADVANCED .7 DNX12 (GAUZE/BANDAGES/DRESSINGS) ×4 IMPLANT
DRSG COVADERM 4X10 (GAUZE/BANDAGES/DRESSINGS) ×4 IMPLANT
ELECT LIGASURE LONG (ELECTRODE) ×4 IMPLANT
ELECT REM PT RETURN 9FT ADLT (ELECTROSURGICAL) ×4
ELECTRODE REM PT RTRN 9FT ADLT (ELECTROSURGICAL) ×2 IMPLANT
GLOVE BIO SURGEON STRL SZ7 (GLOVE) ×20 IMPLANT
GLOVE BIOGEL PI IND STRL 6.5 (GLOVE) ×2 IMPLANT
GLOVE BIOGEL PI INDICATOR 6.5 (GLOVE) ×2
GOWN STRL REUS W/TWL LRG LVL3 (GOWN DISPOSABLE) ×16 IMPLANT
IV LACTATED RINGER IRRG 3000ML (IV SOLUTION) ×2
IV LR IRRIG 3000ML ARTHROMATIC (IV SOLUTION) ×2 IMPLANT
NEEDLE INSUFFLATION 120MM (ENDOMECHANICALS) ×4 IMPLANT
NS IRRIG 1000ML POUR BTL (IV SOLUTION) ×8 IMPLANT
PACK LAVH (CUSTOM PROCEDURE TRAY) ×4 IMPLANT
PROTECTOR NERVE ULNAR (MISCELLANEOUS) ×4 IMPLANT
SEALER TISSUE G2 CVD JAW 45CM (ENDOMECHANICALS) ×4 IMPLANT
SET IRRIG TUBING LAPAROSCOPIC (IRRIGATION / IRRIGATOR) ×4 IMPLANT
SPONGE GAUZE 2X2 8PLY STER LF (GAUZE/BANDAGES/DRESSINGS) ×1
SPONGE GAUZE 2X2 8PLY STRL LF (GAUZE/BANDAGES/DRESSINGS) ×3 IMPLANT
SUT MON AB 2-0 CT1 27 (SUTURE) ×4 IMPLANT
SUT MON AB 2-0 CT1 36 (SUTURE) ×8 IMPLANT
SUT VIC AB 0 CT1 18XCR BRD8 (SUTURE) ×6 IMPLANT
SUT VIC AB 0 CT1 36 (SUTURE) ×4 IMPLANT
SUT VIC AB 0 CT1 8-18 (SUTURE) ×6
SUT VICRYL 0 TIES 12 18 (SUTURE) ×4 IMPLANT
SUT VICRYL 4-0 PS2 18IN ABS (SUTURE) ×4 IMPLANT
TOWEL OR 17X24 6PK STRL BLUE (TOWEL DISPOSABLE) ×8 IMPLANT
TRAY FOLEY CATH 14FR (SET/KITS/TRAYS/PACK) ×4 IMPLANT
TROCAR OPTI TIP 5M 100M (ENDOMECHANICALS) ×8 IMPLANT
TROCAR XCEL DIL TIP R 11M (ENDOMECHANICALS) ×4 IMPLANT
WARMER LAPAROSCOPE (MISCELLANEOUS) ×4 IMPLANT
WATER STERILE IRR 1000ML POUR (IV SOLUTION) ×4 IMPLANT

## 2013-12-02 NOTE — Op Note (Signed)
Preoperative diagnosis: Acute and chronic pelvic pain, pelvic endometriosis  Postoperative diagnosis: Same  Procedure: LAVH, BSO  Surgeon: Marcelle OverlieHolland  Anesthesia: Gen.  EBL: 150 cc  Specimens removed: Uterus bilateral tubes and ovaries, to pathology  Procedure and findings:  Patient taken the operating room after an adequate level of general anesthesia was obtained with the patient's legs in stirrups the abdomen perineum and vagina were prepped and draped in the usual fashion for LAVH, Foley catheter was positioned. EUA carried out uterus mid position normal size mobile adnexa negative., Hulka tenaculum was positioned. Prior to this appropriate timeout had been taken.   Attention directed to the subumbilical area where which was infiltrated with quarter percent Marcaine, small incision was made in the varies needle was introduced without difficulty. Its intra-abdominal position was verified by pressure water testing. After 2-1/2 L pneumoperitoneum syncopated lap scopic trocar and sleeve were then introduced that difficulty. There was no evidence of any bleeding or trauma. 3 finger breaths above the symphysis in the midline a 5 mm trocar was inserted under direct visualization. The patient was then placed in Trendelenburg the pelvic findings have been documented by recent laparoscopy. Grasper was then used to grasp the right tube and ovary which was placed on traction toward the midline, course the pelvic ureter was noted to be well below, the in seal device was then used to coagulated and divided the out the ligament down to and including the round ligament. The exact same repeated on the opposite side after identifying the course of the ureter well below. These areas were hemostatic vaginal portion the procedure was started at this point.  Legs were extended weighted speculum was positioned cervix grasped with tenaculum, cervical vaginal mucosa was incised posterior culdotomy performed without  difficulty, bladder drained superiorly with sharp and blunt dissection. In sequential manner using the LigaSure device the uterosacral ligament, cardinal ligament and uterine vasculature pedicles were coagulated and divided. The fundus of the uterus is in delivered posteriorly remaining pedicles were coagulated and divided. Vaginal cuff was then closed from 3 to 9:00 with a running locked 2-0 Vicryl suture. Prior to closure sponge denies precast reported as correct x2. Vaginal mucosa was then closed right to left with interrupted 2-0 Monocryl sutures. This is hemostatic this point laparoscopy was carried out again after using the [place] irrigated, the operative site was noted be hemostatic even with reduced pressure. Its was removed gas allowed to escape because closed with 4-0 Vicryl subcutaneous for suture and Dermabond she tolerated this well went to recovery room in good condition.  Dictated with [facility]  Monica Osborne M. Milana ObeyHolland M.D.

## 2013-12-02 NOTE — Anesthesia Preprocedure Evaluation (Signed)
Anesthesia Evaluation  Patient identified by MRN, date of birth, ID band Patient awake    Reviewed: Allergy & Precautions, H&P , NPO status , Patient's Chart, lab work & pertinent test results  Airway Mallampati: I TM Distance: >3 FB Neck ROM: full    Dental no notable dental hx. (+) Teeth Intact   Pulmonary neg pulmonary ROS,    Pulmonary exam normal       Cardiovascular negative cardio ROS      Neuro/Psych negative neurological ROS  negative psych ROS   GI/Hepatic negative GI ROS, Neg liver ROS,   Endo/Other  negative endocrine ROS  Renal/GU negative Renal ROS     Musculoskeletal negative musculoskeletal ROS (+)   Abdominal Normal abdominal exam  (+)   Peds  Hematology negative hematology ROS (+)   Anesthesia Other Findings   Reproductive/Obstetrics negative OB ROS                           Anesthesia Physical Anesthesia Plan  ASA: II  Anesthesia Plan: General   Post-op Pain Management:    Induction: Intravenous  Airway Management Planned: Oral ETT  Additional Equipment:   Intra-op Plan:   Post-operative Plan: Extubation in OR  Informed Consent: I have reviewed the patients History and Physical, chart, labs and discussed the procedure including the risks, benefits and alternatives for the proposed anesthesia with the patient or authorized representative who has indicated his/her understanding and acceptance.   Dental Advisory Given  Plan Discussed with: CRNA, Surgeon and Anesthesiologist  Anesthesia Plan Comments:         Anesthesia Quick Evaluation

## 2013-12-02 NOTE — Anesthesia Postprocedure Evaluation (Signed)
  Anesthesia Post-op Note  Patient: [name]  Procedure(s) Performed: Procedure(s): LAPAROSCOPIC ASSISTED VAGINAL HYSTERECTOMY (N/A) SALPINGO OOPHORECTOMY (Bilateral)  Patient Location: PACU and Women's Unit  Anesthesia Type:General  Level of Consciousness: awake, alert  and oriented  Airway and Oxygen Therapy: Patient Spontanous Breathing  Post-op Pain: mild  Post-op Assessment: Patient's Cardiovascular Status Stable, Respiratory Function Stable, No signs of Nausea or vomiting and Pain level controlled  Post-op Vital Signs: stable  Complications: No apparent anesthesia complications

## 2013-12-02 NOTE — Anesthesia Procedure Notes (Signed)
Procedure Name: Intubation Date/Time: 12/02/2013 7:33 AM Performed by: Midori Dado, Jannet AskewHARLESETTA M Pre-anesthesia Checklist: Patient identified, Timeout performed, Emergency Drugs available, Suction available and Patient being monitored Patient Re-evaluated:Patient Re-evaluated prior to inductionOxygen Delivery Method: Circle system utilized Preoxygenation: Pre-oxygenation with 100% oxygen Intubation Type: Inhalational induction Ventilation: Mask ventilation without difficulty Laryngoscope Size: Mac and 3 Grade View: Grade I Tube type: Oral Tube size: 7.0 mm Number of attempts: 1 Placement Confirmation: ETT inserted through vocal cords under direct vision,  breath sounds checked- equal and bilateral and positive ETCO2 Secured at: 21 cm Dental Injury: Teeth and Oropharynx as per pre-operative assessment

## 2013-12-02 NOTE — Anesthesia Postprocedure Evaluation (Deleted)
Anesthesia Post Note  Patient: [name]  Procedure(s) Performed: Procedure(s) (LRB): LAPAROSCOPIC ASSISTED VAGINAL HYSTERECTOMY (N/A) SALPINGO OOPHORECTOMY (Bilateral)  Anesthesia type: Epidural  Patient location: Mother/Baby  Post pain: Pain level controlled  Post assessment: Post-op Vital signs reviewed  Last Vitals:  Filed Vitals:   12/02/13 1326  BP: 130/77  Pulse: 106  Temp: 36.7 C  Resp: 10    Post vital signs: Reviewed  Level of consciousness:alert  Complications: No apparent anesthesia complications

## 2013-12-02 NOTE — OR Nursing (Signed)
Pt noted to have ecchymosis , large area , below umbilicus.

## 2013-12-02 NOTE — Progress Notes (Signed)
The patient was re-examined with no change in status 

## 2013-12-02 NOTE — Anesthesia Postprocedure Evaluation (Signed)
  Anesthesia Post Note  Patient: [name]  Procedure(s) Performed: Procedure(s) (LRB): LAPAROSCOPIC ASSISTED VAGINAL HYSTERECTOMY (N/A) SALPINGO OOPHORECTOMY (Bilateral)  Anesthesia type: GA  Patient location: PACU  Post pain: Pain level controlled  Post assessment: Post-op Vital signs reviewed  Last Vitals:  Filed Vitals:   12/02/13 1000  BP: 130/77  Pulse: 85  Temp: 37.4 C  Resp: 19    Post vital signs: Reviewed  Level of consciousness: sedated  Complications: No apparent anesthesia complications

## 2013-12-02 NOTE — Transfer of Care (Signed)
Immediate Anesthesia Transfer of Care Note  Patient: Monica Osborne  Procedure(s) Performed: Procedure(s): LAPAROSCOPIC ASSISTED VAGINAL HYSTERECTOMY (N/A) SALPINGO OOPHORECTOMY (Bilateral)  Patient Location: PACU  Anesthesia Type:General  Level of Consciousness: awake, alert  and oriented  Airway & Oxygen Therapy: Patient Spontanous Breathing and Patient connected to nasal cannula oxygen  Post-op Assessment: Report given to PACU RN and Post -op Vital signs reviewed and stable  Post vital signs: Reviewed and stable  Complications: No apparent anesthesia complications

## 2013-12-03 ENCOUNTER — Encounter (HOSPITAL_COMMUNITY): Payer: Self-pay | Admitting: Obstetrics and Gynecology

## 2013-12-03 DIAGNOSIS — N809 Endometriosis, unspecified: Secondary | ICD-10-CM

## 2013-12-03 LAB — CBC
HEMATOCRIT: 32.5 % — AB (ref 36.0–46.0)
Hemoglobin: 11.2 g/dL — ABNORMAL LOW (ref 12.0–15.0)
MCH: 34 pg (ref 26.0–34.0)
MCHC: 34.5 g/dL (ref 30.0–36.0)
MCV: 98.8 fL (ref 78.0–100.0)
Platelets: 204 10*3/uL (ref 150–400)
RBC: 3.29 MIL/uL — ABNORMAL LOW (ref 3.87–5.11)
RDW: 12.8 % (ref 11.5–15.5)
WBC: 9.6 10*3/uL (ref 4.0–10.5)

## 2013-12-03 MED ORDER — IBUPROFEN 800 MG PO TABS: 800.0000 mg | ORAL_TABLET | Freq: Three times a day (TID) | ORAL | Status: DC | PRN

## 2013-12-03 MED ORDER — OXYCODONE-ACETAMINOPHEN 5-325 MG PO TABS: 1.0000 | ORAL_TABLET | ORAL | Status: DC | PRN

## 2013-12-03 NOTE — Progress Notes (Signed)
Pt ambulated out  Teaching complete 

## 2013-12-03 NOTE — Discharge Summary (Signed)
Physician Discharge Summary  Patient ID: Monica Osborne MRN: [id number] DOB/AGE: 42/12/1970 42 y.o.  Admit date: 12/02/2013 Discharge date: 12/03/2013  Admission Diagnoses:  Discharge Diagnoses:  Active Problems:   Pelvic pain   Endometriosis   Discharged Condition: good  Hospital Course: LAVH BSO w/o compl for pelvic pain and endometriosis, D/C on POD # 1>>afeb, tol PO  Consults: None  Significant Diagnostic Studies: labs:  Results for orders placed during the hospital encounter of 12/02/13 (from the past 24 hour(s))  CBC     Status: Abnormal   Collection Time    12/03/13  5:05 AM      Result Value Range   WBC 9.6  4.0 - 10.5 K/uL   RBC 3.29 (*) 3.87 - 5.11 MIL/uL   Hemoglobin 11.2 (*) 12.0 - 15.0 g/dL   HCT 78.232.5 (*) 95.636.0 - 21.346.0 %   MCV 98.8  78.0 - 100.0 fL   MCH 34.0  26.0 - 34.0 pg   MCHC 34.5  30.0 - 36.0 g/dL   RDW 08.612.8  57.811.5 - 46.915.5 %   Platelets 204  150 - 400 K/uL    Treatments: surgery: LAVH BSO  Discharge Exam: Blood pressure 128/73, pulse 65, temperature 97.8 F (36.6 C), temperature source Oral, resp. rate 16, height 5\' 7"  (1.702 m), weight 70.308 kg (155 lb), last menstrual period 11/19/2013, SpO2 97.00%. abd soft , flat + BS...incs C/D  Disposition: 01-Home or Self Care     Medication List    STOP taking these medications       diphenhydrAMINE 25 MG tablet  Commonly known as:  BENADRYL      TAKE these medications       ibuprofen 800 MG tablet  Commonly known as:  ADVIL,MOTRIN  Take 1 tablet (800 mg total) by mouth every 8 (eight) hours as needed for moderate pain (mild pain).     oxyCODONE-acetaminophen 5-325 MG per tablet  Commonly known as:  PERCOCET/ROXICET  Take 1-2 tablets by mouth every 4 (four) hours as needed for severe pain (moderate to severe pain (when tolerating fluids)).           Follow-up Information   Follow up with Meriel PicaHOLLAND,Nelsy Madonna M, MD. Schedule an appointment as soon as possible for a visit in 1 week.   Specialty:  Obstetrics and Gynecology   Contact information:   [address] [place] [address] [phone]       Signed: Meriel PicaHOLLAND,Coyt Govoni M 12/03/2013, 8:05 AM

## 2014-06-08 ENCOUNTER — Ambulatory Visit: Payer: Self-pay | Admitting: Physician Assistant

## 2014-11-19 ENCOUNTER — Ambulatory Visit: Payer: Self-pay | Admitting: Physician Assistant

## 2017-01-18 ENCOUNTER — Encounter (HOSPITAL_COMMUNITY): Payer: Self-pay | Admitting: Emergency Medicine

## 2017-01-18 ENCOUNTER — Emergency Department (HOSPITAL_COMMUNITY): Payer: Commercial Managed Care - PPO

## 2017-01-18 ENCOUNTER — Observation Stay (HOSPITAL_COMMUNITY)
Admission: EM | Admit: 2017-01-18 | Discharge: 2017-01-19 | Disposition: A | Discharge status: Home / Self Care | Payer: Commercial Managed Care - PPO | Attending: Internal Medicine | Admitting: Internal Medicine

## 2017-01-18 DIAGNOSIS — R079 Chest pain, unspecified: Secondary | ICD-10-CM | POA: Diagnosis present

## 2017-01-18 DIAGNOSIS — Z9882 Breast implant status: Secondary | ICD-10-CM | POA: Insufficient documentation

## 2017-01-18 DIAGNOSIS — I493 Ventricular premature depolarization: Secondary | ICD-10-CM | POA: Diagnosis not present

## 2017-01-18 DIAGNOSIS — Z8249 Family history of ischemic heart disease and other diseases of the circulatory system: Secondary | ICD-10-CM | POA: Diagnosis not present

## 2017-01-18 DIAGNOSIS — K219 Gastro-esophageal reflux disease without esophagitis: Secondary | ICD-10-CM

## 2017-01-18 DIAGNOSIS — I200 Unstable angina: Secondary | ICD-10-CM | POA: Diagnosis not present

## 2017-01-18 DIAGNOSIS — Z6826 Body mass index (BMI) 26.0-26.9, adult: Secondary | ICD-10-CM | POA: Diagnosis not present

## 2017-01-18 DIAGNOSIS — F419 Anxiety disorder, unspecified: Secondary | ICD-10-CM | POA: Insufficient documentation

## 2017-01-18 DIAGNOSIS — R9431 Abnormal electrocardiogram [ECG] [EKG]: Secondary | ICD-10-CM | POA: Diagnosis present

## 2017-01-18 DIAGNOSIS — Z882 Allergy status to sulfonamides status: Secondary | ICD-10-CM | POA: Insufficient documentation

## 2017-01-18 DIAGNOSIS — Z79899 Other long term (current) drug therapy: Secondary | ICD-10-CM | POA: Insufficient documentation

## 2017-01-18 DIAGNOSIS — N809 Endometriosis, unspecified: Secondary | ICD-10-CM | POA: Insufficient documentation

## 2017-01-18 DIAGNOSIS — R0789 Other chest pain: Secondary | ICD-10-CM | POA: Diagnosis not present

## 2017-01-18 DIAGNOSIS — R072 Precordial pain: Secondary | ICD-10-CM

## 2017-01-18 DIAGNOSIS — Z7982 Long term (current) use of aspirin: Secondary | ICD-10-CM | POA: Insufficient documentation

## 2017-01-18 DIAGNOSIS — R635 Abnormal weight gain: Secondary | ICD-10-CM | POA: Insufficient documentation

## 2017-01-18 DIAGNOSIS — E785 Hyperlipidemia, unspecified: Secondary | ICD-10-CM | POA: Diagnosis not present

## 2017-01-18 DIAGNOSIS — Z90710 Acquired absence of both cervix and uterus: Secondary | ICD-10-CM | POA: Diagnosis not present

## 2017-01-18 DIAGNOSIS — J329 Chronic sinusitis, unspecified: Secondary | ICD-10-CM | POA: Diagnosis present

## 2017-01-18 DIAGNOSIS — J019 Acute sinusitis, unspecified: Secondary | ICD-10-CM | POA: Diagnosis not present

## 2017-01-18 DIAGNOSIS — R0602 Shortness of breath: Secondary | ICD-10-CM | POA: Diagnosis present

## 2017-01-18 DIAGNOSIS — Z885 Allergy status to narcotic agent status: Secondary | ICD-10-CM | POA: Diagnosis not present

## 2017-01-18 DIAGNOSIS — F329 Major depressive disorder, single episode, unspecified: Secondary | ICD-10-CM | POA: Insufficient documentation

## 2017-01-18 HISTORY — DX: Anxiety disorder, unspecified: F41.9

## 2017-01-18 HISTORY — DX: Major depressive disorder, single episode, unspecified: F32.9

## 2017-01-18 HISTORY — DX: Depression, unspecified: F32.A

## 2017-01-18 LAB — BASIC METABOLIC PANEL WITH GFR
Anion gap: 9 (ref 5–15)
BUN: 8 mg/dL (ref 6–20)
CO2: 26 mmol/L (ref 22–32)
Calcium: 9.5 mg/dL (ref 8.9–10.3)
Chloride: 105 mmol/L (ref 101–111)
Creatinine, Ser: 0.78 mg/dL (ref 0.44–1.00)
GFR calc Af Amer: >60 mL/min
GFR calc non Af Amer: >60 mL/min
Glucose, Bld: 108 mg/dL — ABNORMAL HIGH (ref 65–99)
Potassium: 3.7 mmol/L (ref 3.5–5.1)
Sodium: 140 mmol/L (ref 135–145)

## 2017-01-18 LAB — CBC
HCT: 41 % (ref 36.0–46.0)
Hemoglobin: 14.1 g/dL (ref 12.0–15.0)
MCH: 33.3 pg (ref 26.0–34.0)
MCHC: 34.4 g/dL (ref 30.0–36.0)
MCV: 96.7 fL (ref 78.0–100.0)
Platelets: 263 K/uL (ref 150–400)
RBC: 4.24 MIL/uL (ref 3.87–5.11)
RDW: 13.1 % (ref 11.5–15.5)
WBC: 10.3 K/uL (ref 4.0–10.5)

## 2017-01-18 LAB — PREGNANCY, URINE: Preg Test, Ur: NEGATIVE

## 2017-01-18 LAB — I-STAT TROPONIN, ED: Troponin i, poc: 0 ng/mL (ref 0.00–0.08)

## 2017-01-18 MED ORDER — NITROGLYCERIN 0.4 MG SL SUBL
0.4000 mg | SUBLINGUAL_TABLET | SUBLINGUAL | Status: DC | PRN
  Administered 2017-01-18: 0.4 mg via SUBLINGUAL
  Filled 2017-01-18: qty 1

## 2017-01-18 MED ORDER — HEPARIN (PORCINE) IN NACL 100-0.45 UNIT/ML-% IJ SOLN
950.0000 [IU]/h | INTRAMUSCULAR | Status: DC
  Administered 2017-01-19 (×2): 950 [IU]/h via INTRAVENOUS
  Filled 2017-01-18: qty 250

## 2017-01-18 MED ORDER — LORAZEPAM 1 MG PO TABS
1.0000 mg | ORAL_TABLET | Freq: Once | ORAL | Status: AC
  Administered 2017-01-18: 1 mg via ORAL
  Filled 2017-01-18: qty 1

## 2017-01-18 MED ORDER — ASPIRIN 81 MG PO CHEW
324.0000 mg | CHEWABLE_TABLET | Freq: Once | ORAL | Status: AC
  Administered 2017-01-18: 324 mg via ORAL
  Filled 2017-01-18: qty 4

## 2017-01-18 MED ORDER — IOPAMIDOL (ISOVUE-370) INJECTION 76%
INTRAVENOUS | Status: AC
  Administered 2017-01-18: 100 mL
  Filled 2017-01-18: qty 100

## 2017-01-18 MED ORDER — HEPARIN BOLUS VIA INFUSION
4000.0000 [IU] | Freq: Once | INTRAVENOUS | Status: AC
Start: 2017-01-18 — End: 2017-01-19
  Administered 2017-01-19: 4000 [IU] via INTRAVENOUS
  Filled 2017-01-18: qty 4000

## 2017-01-18 MED ORDER — SODIUM CHLORIDE 0.9 % IV BOLUS (SEPSIS)
500.0000 mL | Freq: Once | INTRAVENOUS | Status: AC
  Administered 2017-01-18: 500 mL via INTRAVENOUS

## 2017-01-18 NOTE — ED Notes (Signed)
Pt reports she has had 2 sinus infections back to back. Pt reports having SOB since going to UC today where they told pt she had abnormal EKG.

## 2017-01-18 NOTE — ED Notes (Signed)
Delay in lab draw,  Pt not in room 

## 2017-01-18 NOTE — ED Notes (Signed)
Cardiology at bedside.

## 2017-01-18 NOTE — ED Provider Notes (Signed)
Emergency Department Provider Note   I have reviewed the triage vital signs and the nursing notes.   HISTORY  Chief Complaint Chest Pain and Abnormal ECG   HPI Monica Osborne is a 46 y.o. female with no significant PMH presents to the emergent department for evaluation of chest pain. Patient states she's had chest pain for the past 2 days. She thought it was related to a sinus infection. She has pain with coughing, deep breathing, drinking water. No alleviating factors. She went to her primary care physician today who performed an EKG and referred to the emergency department because it was abnormal. Since being referred to the emergency department the patients develop some difficulty breathing and report of increasing anxiety. She describes a constant aching pain in the right side of her chest that is nonradiating and mild to moderate in severity. No history of prior. No tobacco use. Does have a positive family history.    Past Medical History:  Diagnosis Date  . Anxiety and depression   . Pelvic pain in female     Patient Active Problem List   Diagnosis Date Noted  . Chest pain 01/19/2017  . Abnormal EKG 01/19/2017  . Sinus infection 01/19/2017  . Endometriosis 12/03/2013  . Pelvic pain 12/02/2013    Past Surgical History:  Procedure Laterality Date  . ABDOMINAL HYSTERECTOMY    . BREAST ENHANCEMENT SURGERY    . LAPAROSCOPIC ASSISTED VAGINAL HYSTERECTOMY N/A 12/02/2013   Procedure: LAPAROSCOPIC ASSISTED VAGINAL HYSTERECTOMY;  Surgeon: Meriel Pica, MD;  Location: [facility];  Service: Gynecology;  Laterality: N/A;  . LAPAROSCOPY N/A 11/24/2013   Procedure: LAPAROSCOPY DIAGNOSTIC;  Surgeon: Meriel Pica, MD;  Location: [facility];  Service: Gynecology;  Laterality: N/A;  . NASAL SINUS SURGERY    . SALPINGOOPHORECTOMY Bilateral 12/02/2013   Procedure: SALPINGO OOPHORECTOMY;  Surgeon: Meriel Pica, MD;  Location: [facility];  Service:  Gynecology;  Laterality: Bilateral;  . TUBAL LIGATION  2007      Allergies Morphine and related and Sulfa antibiotics  History reviewed. No pertinent family history.  Social History Social History  Substance Use Topics  . Smoking status: Never Smoker  . Smokeless tobacco: Never Used  . Alcohol use 0.0 oz/week     Comment: socially    Review of Systems  Constitutional: No fever/chills Eyes: No visual changes. ENT: No sore throat. Cardiovascular: Positive chest pain. Respiratory: Positive shortness of breath. Gastrointestinal: No abdominal pain.  No nausea, no vomiting.  No diarrhea.  No constipation. Genitourinary: Negative for dysuria. Musculoskeletal: Negative for back pain. Skin: Negative for rash. Neurological: Negative for headaches, focal weakness or numbness.  10-point ROS otherwise negative.  ____________________________________________   PHYSICAL EXAM:  VITAL SIGNS: ED Triage Vitals  Enc Vitals Group     BP 01/18/17 1955 (!) 159/81     Pulse Rate 01/18/17 1955 (!) 110     Resp 01/18/17 1955 17     Temp 01/18/17 1955 98.5 F (36.9 C)     Temp Source 01/18/17 1955 Oral     SpO2 01/18/17 1955 99 %     Weight 01/18/17 1955 178 lb (80.7 kg)     Height 01/18/17 1955 5\' 8"  (1.727 m)     Pain Score 01/18/17 2002 0   Constitutional: Alert and oriented. Tearful and breathing quickly.  Eyes: Conjunctivae are normal. Head: Atraumatic. Nose: No congestion/rhinnorhea. Mouth/Throat: Mucous membranes are moist.  Neck: No stridor.   Cardiovascular: Sinus tachycardia. Good peripheral  circulation. Grossly normal heart sounds.   Respiratory: Increased respiratory effort and tachypnea. No retractions. Lungs CTAB. Gastrointestinal: Soft and nontender. No distention.  Musculoskeletal: No lower extremity tenderness nor edema. No gross deformities of extremities. Neurologic:  Normal speech and language. No gross focal neurologic deficits are appreciated.  Skin:  Skin  is warm, dry and intact. No rash noted. Psychiatric: Mood and affect are normal. Speech and behavior are normal.  ____________________________________________   LABS (all labs ordered are listed, but only abnormal results are displayed)  Labs Reviewed  BASIC METABOLIC PANEL - Abnormal; Notable for the following:       Result Value   Glucose, Bld 108 (*)    All other components within normal limits  LIPID PANEL - Abnormal; Notable for the following:    Cholesterol 201 (*)    LDL Cholesterol 119 (*)    All other components within normal limits  BASIC METABOLIC PANEL - Abnormal; Notable for the following:    Glucose, Bld 111 (*)    All other components within normal limits  TSH - Abnormal; Notable for the following:    TSH 5.155 (*)    All other components within normal limits  CBC  PREGNANCY, URINE  HEPARIN LEVEL (UNFRACTIONATED)  CBC  TROPONIN I  TROPONIN I  PROTIME-INR  TROPONIN I  HEPARIN LEVEL (UNFRACTIONATED)  I-STAT TROPOININ, ED  I-STAT TROPOININ, ED   ____________________________________________  EKG   EKG Interpretation  Date/Time:  Thursday January 18 2017 22:47:54 EDT Ventricular Rate:  92 PR Interval:  138 QRS Duration: 87 QT Interval:  334 QTC Calculation: 414 R Axis:   70 Text Interpretation:  Sinus tachycardia Paired ventricular premature complexes Probable left atrial enlargement Low voltage, precordial leads Nonspecific T abnormalities, anterior leads No STEMI.  Confirmed by Cory Kitt MD, Deondrea Aguado [phone]) on 01/18/2017 11:13:43 PM Also confirmed by Aurel Nguyen MD, Wilmary Levit [phone]), editor Stout CT, Jola Babinski [phone])  on 01/19/2017 7:06:21 AM       ____________________________________________  RADIOLOGY  Dg Chest 2 View  Result Date: 01/18/2017 CLINICAL DATA:  Mid to right chest pain with shortness of breath EXAM: CHEST  2 VIEW COMPARISON:  None. FINDINGS: The heart size and mediastinal contours are within normal limits. Mild atherosclerosis. Both lungs are clear.  The visualized skeletal structures are unremarkable. IMPRESSION: No active cardiopulmonary disease. Electronically Signed   By: Jasmine Pang M.D.   On: 01/18/2017 20:38   Ct Angio Chest Pe W And/or Wo Contrast  Result Date: 01/19/2017 CLINICAL DATA:  45 year old female with chest pain. EXAM: CT ANGIOGRAPHY CHEST WITH CONTRAST TECHNIQUE: Multidetector CT imaging of the chest was performed using the standard protocol during bolus administration of intravenous contrast. Multiplanar CT image reconstructions and MIPs were obtained to evaluate the vascular anatomy. CONTRAST:  100 cc Isovue 370 COMPARISON:  Chest radiograph dated 01/19/2017 FINDINGS: Cardiovascular: There is no cardiomegaly or pericardial effusion. The thoracic aorta appears unremarkable. The origins of the great vessels of the aortic arch appear patent. There is no CT evidence of pulmonary embolism. Mediastinum/Nodes: There is no hilar or mediastinal adenopathy. The esophagus is grossly unremarkable. No thyroid nodules identified. Lungs/Pleura: The lungs are clear. There is no pleural effusion or pneumothorax. The central airways are patent. Upper Abdomen: The visualized upper abdomen is unremarkable. Musculoskeletal: No chest wall abnormality. No acute or significant osseous findings. Bilateral breast implants noted. Review of the MIP images confirms the above findings. IMPRESSION: No acute intrathoracic pathology. No CT evidence of pulmonary embolism. Electronically Signed  By: Elgie Collard M.D.   On: 01/19/2017 00:19    ____________________________________________   PROCEDURES  Procedure(s) performed:   Procedures  CRITICAL CARE Performed by: Maia Plan Total critical care time: 30 minutes Critical care time was exclusive of separately billable procedures and treating other patients. Critical care was necessary to treat or prevent imminent or life-threatening deterioration. Critical care was time spent personally by me on  the following activities: development of treatment plan with patient and/or surrogate as well as nursing, discussions with consultants, evaluation of patient's response to treatment, examination of patient, obtaining history from patient or surrogate, ordering and performing treatments and interventions, ordering and review of laboratory studies, ordering and review of radiographic studies, pulse oximetry and re-evaluation of patient's condition.  Alona Bene, MD Emergency Medicine   ____________________________________________   INITIAL IMPRESSION / ASSESSMENT AND PLAN / ED COURSE  Pertinent labs & imaging results that were available during my care of the patient were reviewed by me and considered in my medical decision making (see chart for details).  Patient resents to the emergency department for evaluation of chest discomfort. She has an EKG concerning for acute ischemia/unstable angina. Not a STEMI. Troponin from the waiting room is negative. Remaining labs pending. Chest x-ray unremarkable. Patient is having active chest pain. Plan to give aspirin, nitroglycerin, Ativan as the patient is tearful at times is complaining of some difficulty breathing. The dyspnea seems to be since she was referred to the emergency department and not prior. She also has some tachycardia which may also be related to some increased anxiety but given her associated pleuritic chest pain I plan for CT Angio of the chest. Will discuss abnormal EKG with Cardiology.   09:25 PM Cardiology at bedside. Discussed case and EKG. Troponin negative. Will obtain CTA to r/o PE with pleuritic pain and reassess. Likely hospitalist admit for enzyme trending. Will start heparin drip.   10:20 PM Patient with resolution of chest pain after nitroglycerine, ASA, and Ativan. Patient felt much better after Nitro specifically. Awaiting CTA. Will start Heparin.   11:50 PM Spoke with hospitalist Dr. Manson Passey with [facility]. Will wait for second  troponin prior to admission with hospitalist. Repeat troponin pending.  ____________________________________________  FINAL CLINICAL IMPRESSION(S) / ED DIAGNOSES  Final diagnoses:  Precordial chest pain  Shortness of breath  Unstable angina (HCC)     MEDICATIONS GIVEN DURING THIS VISIT:  Medications  nitroGLYCERIN (NITROSTAT) SL tablet 0.4 mg (0.4 mg Sublingual Given 01/18/17 2119)  heparin ADULT infusion 100 units/mL (25000 units/241mL sodium chloride 0.45%) (950 Units/hr Intravenous Transfusing/Transfer 01/19/17 0117)  acetaminophen (TYLENOL) tablet 650 mg (not administered)  ondansetron (ZOFRAN) injection 4 mg (not administered)  gi cocktail (Maalox,Lidocaine,Donnatal) (not administered)  amoxicillin-clavulanate (AUGMENTIN) 875-125 MG per tablet 1 tablet (1 tablet Oral Given 01/19/17 0324)  benzonatate (TESSALON) capsule 100 mg (100 mg Oral Given 01/19/17 1013)  guaiFENesin-codeine 100-10 MG/5ML solution 5 mL (not administered)  pantoprazole (PROTONIX) EC tablet 40 mg (not administered)  aspirin chewable tablet 324 mg (324 mg Oral Given 01/18/17 2118)  LORazepam (ATIVAN) tablet 1 mg (1 mg Oral Given 01/18/17 2118)  sodium chloride 0.9 % bolus 500 mL (0 mLs Intravenous Stopped 01/18/17 2314)  heparin bolus via infusion 4,000 Units (4,000 Units Intravenous Bolus from Bag 01/19/17 0116)  iopamidol (ISOVUE-370) 76 % injection (100 mLs  Contrast Given 01/18/17 2344)  LORazepam (ATIVAN) tablet 1 mg (1 mg Oral Given 01/19/17 0324)     NEW OUTPATIENT MEDICATIONS STARTED DURING THIS VISIT:  None   Note:  This document was prepared using Dragon voice recognition software and may include unintentional dictation errors.  Alona BeneJoshua Juanita Devincent, MD Emergency Medicine   Maia PlanJoshua G Meyer Arora, MD 01/19/17 [phone]

## 2017-01-18 NOTE — H&P (Signed)
History and Physical    Monica Osborne VOZ:[id number] DOB: 03/23/1971 DOA: 01/18/2017  Referring MD/NP/PA: Dr. Jacqulyn BathLong PCP: Pcp Not In System  Patient coming from: PCP office  Chief Complaint: Chest pain  HPI: Monica Osborne is a 46 y.o. female with medical history significant of anxiety and depression; who presents with complaints of right-sided chest pain for last 2 days. Reports pain is pleuritic in nature. Associated symptoms included shortness of breath and 20 pound weight gain since Christmas. Denies any nausea, vomiting, lightheadedness, palpitations, diaphoresis, or dysuria. She initially related symptoms to a sinus infection she had since last week she has been placed on Augmentin and only has 2 more days left to complete course. She followed up with her primary care provider today due to her symptoms, and was seen to have an abnormal EKG for which she was sent to the emergency department for further evaluation. Patient has a significant family history of heart attacks at a young age. Notes her father having his first heart attack at age of 45 and paternal uncle who died from heart attack at age 45.  ED Course:  Upon admission to the emergency department patient was seen to be afebrile, pulse 96-128, respirations 16-25, blood pressure 132/94, and O2 saturation maintained on RA. Lab work was relatively unremarkable and initial troponin negative. Cardiology was consulted due to abnormal EKG findings and recommended patient to be placed on a heparin drip. CT angiogram of the chest negative for PE.Patient was given aspirin, nitroglycerin, Ativan, and started on heparin drip while in the ED.   Review of Systems: As per HPI otherwise 10 point review of systems negative.   Past Medical History:  Diagnosis Date  . Medical history non-contributory   . Pelvic pain in female     Past Surgical History:  Procedure Laterality Date  . ABDOMINAL HYSTERECTOMY    . BREAST  ENHANCEMENT SURGERY    . LAPAROSCOPIC ASSISTED VAGINAL HYSTERECTOMY N/A 12/02/2013   Procedure: LAPAROSCOPIC ASSISTED VAGINAL HYSTERECTOMY;  Surgeon: Meriel Picaichard M Holland, MD;  Location: [facility];  Service: Gynecology;  Laterality: N/A;  . LAPAROSCOPY N/A 11/24/2013   Procedure: LAPAROSCOPY DIAGNOSTIC;  Surgeon: Meriel Picaichard M Holland, MD;  [facility];  Service: Gynecology;  Laterality: N/A;  . NASAL SINUS SURGERY    . SALPINGOOPHORECTOMY Bilateral 12/02/2013   Procedure: SALPINGO OOPHORECTOMY;  Surgeon: Meriel Picaichard M Holland, MD;  Location: [facility];  Service: Gynecology;  Laterality: Bilateral;  . TUBAL LIGATION  2007     reports that she has never smoked. She has never used smokeless tobacco. She reports that she drinks alcohol. She reports that she does not use drugs.  Allergies  Allergen Reactions  . Morphine And Related Other (See Comments)    Hallucinations  . Sulfa Antibiotics Nausea And Vomiting    History reviewed. No pertinent family history.  Prior to Admission medications   Medication Sig Start Date End Date Taking? Authorizing Provider  amoxicillin-clavulanate (AUGMENTIN) 875-125 MG tablet Take 1 tablet by mouth 2 (two) times daily. For 10 days 01/10/17  Yes Historical Provider, MD  ibuprofen (ADVIL,MOTRIN) 800 MG tablet Take 1 tablet (800 mg total) by mouth every 8 (eight) hours as needed for moderate pain (mild pain). Patient not taking: Reported on 01/18/2017 12/03/13   Richarda Overlieichard Holland, MD  oxyCODONE-acetaminophen (PERCOCET/ROXICET) 5-325 MG per tablet Take 1-2 tablets by mouth every 4 (four) hours as needed for severe pain (moderate to severe pain (when tolerating fluids)). Patient not taking: Reported on  01/18/2017 12/03/13   Richarda Overlie, MD    Physical Exam: Constitutional: NAD, calm, comfortable Vitals:   01/18/17 2057 01/18/17 2124 01/18/17 2130 01/18/17 2200  BP:  (!) 130/92 (!) 139/106 (!) 132/94  Pulse: 98 100 (!) 128 (!) 107  Resp: (!) 22 16 (!) 22  (!) 25  Temp:      TempSrc:      SpO2: 99% 100% 96% 99%  Weight:      Height:       Eyes: PERRL, lids and conjunctivae normal ENMT: Mucous membranes are moist. Posterior pharynx clear of any exudate or lesions.Normal dentition.  Neck: normal, supple, no masses, no thyromegaly Respiratory: clear to auscultation bilaterally, no wheezing, no crackles. Normal respiratory effort. No accessory muscle use.  Cardiovascular: Regular rate and rhythm, no murmurs / rubs / gallops. No extremity edema. 2+ pedal pulses. No carotid bruits.  Abdomen: no tenderness, no masses palpated. No hepatosplenomegaly. Bowel sounds positive.  Musculoskeletal: no clubbing / cyanosis. No joint deformity upper and lower extremities. Good ROM, no contractures. Normal muscle tone.  Skin: no rashes, lesions, ulcers. No induration Neurologic: CN 2-12 grossly intact. Sensation intact, DTR normal. Strength 5/5 in all 4.  Psychiatric: Normal judgment and insight. Alert and oriented x 3. Anxious l mood.     Labs on Admission: I have personally reviewed following labs and imaging studies  CBC:  Recent Labs Lab 01/18/17 2006  WBC 10.3  HGB 14.1  HCT 41.0  MCV 96.7  PLT 263   Basic Metabolic Panel:  Recent Labs Lab 01/18/17 2006  NA 140  K 3.7  CL 105  CO2 26  GLUCOSE 108*  BUN 8  CREATININE 0.78  CALCIUM 9.5   GFR: Estimated Creatinine Clearance: 99 mL/min (by C-G formula based on SCr of 0.78 mg/dL). Liver Function Tests: No results for input(s): AST, ALT, ALKPHOS, BILITOT, PROT, ALBUMIN in the last 168 hours. No results for input(s): LIPASE, AMYLASE in the last 168 hours. No results for input(s): AMMONIA in the last 168 hours. Coagulation Profile: No results for input(s): INR, PROTIME in the last 168 hours. Cardiac Enzymes: No results for input(s): CKTOTAL, CKMB, CKMBINDEX, TROPONINI in the last 168 hours. BNP (last 3 results) No results for input(s): PROBNP in the last 8760 hours. HbA1C: No  results for input(s): HGBA1C in the last 72 hours. CBG: No results for input(s): GLUCAP in the last 168 hours. Lipid Profile: No results for input(s): CHOL, HDL, LDLCALC, TRIG, CHOLHDL, LDLDIRECT in the last 72 hours. Thyroid Function Tests: No results for input(s): TSH, T4TOTAL, FREET4, T3FREE, THYROIDAB in the last 72 hours. Anemia Panel: No results for input(s): VITAMINB12, FOLATE, FERRITIN, TIBC, IRON, RETICCTPCT in the last 72 hours. Urine analysis:    Component Value Date/Time   COLORURINE ORANGE 09/16/2013 1216   APPEARANCEUR HAZY 09/16/2013 1216   LABSPEC SEE COMMENT 09/16/2013 1216   PHURINE see comment 09/16/2013 1216   GLUCOSEU see comment 09/16/2013 1216   HGBUR see comment 09/16/2013 1216   BILIRUBINUR SEE COMMENT 09/16/2013 1216   KETONESUR SEE COMMENT 09/16/2013 1216   PROTEINUR SEE COMMENT 09/16/2013 1216   NITRITE SEE COMMENT 09/16/2013 1216   LEUKOCYTESUR SEE COMMENT 09/16/2013 1216   Sepsis Labs: No results found for this or any previous visit (from the past 240 hour(s)).   Radiological Exams on Admission: Dg Chest 2 View  Result Date: 01/18/2017 CLINICAL DATA:  Mid to right chest pain with shortness of breath EXAM: CHEST  2 VIEW COMPARISON:  None.  FINDINGS: The heart size and mediastinal contours are within normal limits. Mild atherosclerosis. Both lungs are clear. The visualized skeletal structures are unremarkable. IMPRESSION: No active cardiopulmonary disease. Electronically Signed   By: Jasmine Pang M.D.   On: 01/18/2017 20:38    EKG: Independently reviewed. Sinus tachycardia with. PVCs  Assessment/Plan Chest pain: Acute. Positive family history and abnormal EKG. - Admit to stepdown bed - Chest pain order set initially - Trend cardiac troponins - Continue heparin gtt  - check echocardiogram in a.m. - Appreciate cardiology consultative service, follow-up for further recommendations  Abnormal EKG - Follow-up telemetry overnight  Sinus infection -  Continue Augmentin for total of 5 more doses to complete course  DVT prophylaxis: heparin gtt Code Status: Full Family Communication: Discussed plan of care with the patient and family members present at bedside Disposition Plan: Possible discharge home in a.m. Consults called: Cardiology  Admission status: Observation  Clydie Braun MD [facility] Pager [phone]  If 7PM-7AM, please contact night-coverage [email or web address] Password [facility]  01/18/2017, 11:51 PM

## 2017-01-18 NOTE — ED Notes (Signed)
Pt refused 3rd nitro c/o headache

## 2017-01-18 NOTE — ED Triage Notes (Addendum)
Pt states she is been having CP for the past 2 days, went  today to the [facility] and they told her that she had abnormal EKG, pt is very anxious on triage and crying, states that she is having some SOB. Denies any CP at this time.

## 2017-01-18 NOTE — ED Notes (Signed)
Patient transported to CT 

## 2017-01-18 NOTE — ED Notes (Signed)
ED Provider at bedside. 

## 2017-01-18 NOTE — Consult Note (Signed)
Cardiology Consult    Patient ID: Monica Osborne MRN: [id number], DOB/AGE: 45/12/1970   Admit date: 01/18/2017 Date of Consult: 01/18/2017  Primary Physician: Pcp Not In System Primary Cardiologist: none Requesting Provider: Dr. Jacqulyn BathLong  Patient Profile    46 y o woman with chest pain  Past Medical History   Past Medical History:  Diagnosis Date  . Medical history non-contributory   . Pelvic pain in female     Past Surgical History:  Procedure Laterality Date  . ABDOMINAL HYSTERECTOMY    . BREAST ENHANCEMENT SURGERY    . LAPAROSCOPIC ASSISTED VAGINAL HYSTERECTOMY N/A 12/02/2013   Procedure: LAPAROSCOPIC ASSISTED VAGINAL HYSTERECTOMY;  Surgeon: Meriel Picaichard M Holland, MD;  Location: [facility];  Service: Gynecology;  Laterality: N/A;  . LAPAROSCOPY N/A 11/24/2013   Procedure: LAPAROSCOPY DIAGNOSTIC;  Surgeon: Meriel Picaichard M Holland, MD;  [facility] [place];  Service: Gynecology;  Laterality: N/A;  . NASAL SINUS SURGERY    . SALPINGOOPHORECTOMY Bilateral 12/02/2013   Procedure: SALPINGO OOPHORECTOMY;  Surgeon: Meriel Picaichard M Holland, MD;  Location: [facility];  Service: Gynecology;  Laterality: Bilateral;  . TUBAL LIGATION  2007     Allergies  Allergies  Allergen Reactions  . Morphine And Related Other (See Comments)    Hallucinations  . Sulfa Antibiotics Nausea And Vomiting    History of Present Illness    Monica Osborne has no sign PMH. She was started on augmentin few days ago for sinusitis. She now developed CP fr 2 days. Started at rest. Taking in deep breaths makes it worse. Nothing else makes it worse or better. No SOB, PND, orthopnea or LEE. She exercises daily by walking 10.000 steps. 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   No recent stressors. No meds besides abx.   No smoking, no illicit drug use. Does not report family history fo early onset CAD.   Inpatient Medications    None  Family History    History reviewed. No  pertinent family history.  Social History    Social History   Social History  . Marital status: Single    Spouse name: N/A  . Number of children: N/A  . Years of education: N/A   Occupational History  . Not on file.   Social History Main Topics  . Smoking status: Never Smoker  . Smokeless tobacco: Never Used  . Alcohol use 0.0 oz/week     Comment: socially  . Drug use: No  . Sexual activity: Not on file   Other Topics Concern  . Not on file   Social History Narrative  . No narrative on file     Review of Systems    General:  No chills, fever, night sweats or weight changes.  Cardiovascular:  No chest pain, dyspnea on exertion, edema, orthopnea, palpitations, paroxysmal nocturnal dyspnea. Dermatological: No rash, lesions/masses Respiratory: No cough, dyspnea Urologic: No hematuria, dysuria Abdominal:   No nausea, vomiting, diarrhea, bright red blood per rectum, melena, or hematemesis Neurologic:  No visual changes, wkns, changes in mental status. All other systems reviewed and are otherwise negative except as noted above.  Physical Exam    Blood pressure (!) 139/106, pulse (!) 128, temperature 98.5 F (36.9 C), temperature source Oral, resp. rate (!) 22, height 5\' 8"  (1.727 m), weight 80.7 kg (178 lb), last menstrual period 11/19/2013, SpO2 96 %.  General: Pleasant, NAD Psych: Normal affect. Neuro: Alert and oriented X 3. Moves all extremities spontaneously.  HEENT: Normal  Neck: Supple without bruits or JVD. Lungs:  Resp regular and unlabored, CTA. Heart: RRR no s3, s4, or murmurs. Abdomen: Soft, non-tender, non-distended, BS + x 4.  Extremities: No clubbing, cyanosis or edema. DP/PT/Radials 2+ and equal bilaterally.  Labs    Troponin [facility] of Care Test)  Recent Labs  01/18/17 2023  TROPIPOC 0.00   No results for input(s): CKTOTAL, CKMB, TROPONINI in the last 72 hours. Lab Results  Component Value Date   WBC 10.3 01/18/2017   HGB 14.1 01/18/2017    HCT 41.0 01/18/2017   MCV 96.7 01/18/2017   PLT 263 01/18/2017    Recent Labs Lab 01/18/17 2006  NA 140  K 3.7  CL 105  CO2 26  BUN 8  CREATININE 0.78  CALCIUM 9.5  GLUCOSE 108*   No results found for: CHOL, HDL, LDLCALC, TRIG No results found for: [facility]   Radiology Studies    Dg Chest 2 View  Result Date: 01/18/2017 CLINICAL DATA:  Mid to right chest pain with shortness of breath EXAM: CHEST  2 VIEW COMPARISON:  None. FINDINGS: The heart size and mediastinal contours are within normal limits. Mild atherosclerosis. Both lungs are clear. The visualized skeletal structures are unremarkable. IMPRESSION: No active cardiopulmonary disease. Electronically Signed   By: Jasmine Pang M.D.   On: 01/18/2017 20:38    ECG & Cardiac Imaging    ECG with sinus tach and St depressions in infero alteral leads.   Assessment & Plan    Monica Osborne is a young 46 y o woman with UA. Her presentation is most concerning for angina in setting of CAD. Trop neg as of now.  Less likely pericarditis and I think the sinusitis is likely a red herring. She does not have obvious risk factors I could illicit.   Recommendations:  - ED plans on CTA to rule out PE. Probably unlikely that she has a PE.  - Would start on Heparin drip. - Give nitro and see if she gets improvement in pain  - TTE in am - Risk profiling including A1c, TFT - consider LHC in am unless complete resolution of symptoms and ECG and normal TTE in am  Signed, Macario Golds, MD 01/18/2017, 10:06 PM

## 2017-01-18 NOTE — Progress Notes (Signed)
ANTICOAGULATION CONSULT NOTE - Initial Consult  Pharmacy Consult for Heparin  Indication: chest pain/ACS  Allergies  Allergen Reactions  . Morphine And Related Other (See Comments)    Hallucinations  . Sulfa Antibiotics Nausea And Vomiting    Patient Measurements: Height: 5\' 8"  (172.7 cm) Weight: 178 lb (80.7 kg) IBW/kg (Calculated) : 63.9 Heparin Dosing Weight: 80 kg  Vital Signs: Temp: 98.5 F (36.9 C) (03/15 1955) Temp Source: Oral (03/15 1955) BP: 139/106 (03/15 2130) Pulse Rate: 128 (03/15 2130)  Labs:  Recent Labs  01/18/17 2006  HGB 14.1  HCT 41.0  PLT 263  CREATININE 0.78    Estimated Creatinine Clearance: 99 mL/min (by C-G formula based on SCr of 0.78 mg/dL).   Medical History: Past Medical History:  Diagnosis Date  . Medical history non-contributory   . Pelvic pain in female     Medications:   (Not in a hospital admission)   Assessment: Patient is a [age] yof admitted 3/15 with chest pain. No anticoagulation PTA.   Baseline CBC is within normal limits. No notes of bleeding.  Goal of Therapy:  Heparin level 0.3-0.7 units/ml Monitor platelets by anticoagulation protocol: Yes   Plan:  Give 4000 units bolus x 1 Start heparin infusion at 950 units/hr Check anti-Xa level in 6 hours and daily while on heparin Continue to monitor H&H and platelets  F/U cards plans F/U CTA to also rule out PE  Carylon PerchesMaggie Shuda, PharmD Acute Care Pharmacy Resident  Pager: [phone] 01/18/2017

## 2017-01-19 ENCOUNTER — Other Ambulatory Visit: Payer: Self-pay | Admitting: Physician Assistant

## 2017-01-19 ENCOUNTER — Observation Stay (HOSPITAL_BASED_OUTPATIENT_CLINIC_OR_DEPARTMENT_OTHER): Payer: Commercial Managed Care - PPO

## 2017-01-19 DIAGNOSIS — R072 Precordial pain: Secondary | ICD-10-CM

## 2017-01-19 DIAGNOSIS — R079 Chest pain, unspecified: Secondary | ICD-10-CM

## 2017-01-19 DIAGNOSIS — K219 Gastro-esophageal reflux disease without esophagitis: Secondary | ICD-10-CM

## 2017-01-19 DIAGNOSIS — R9431 Abnormal electrocardiogram [ECG] [EKG]: Secondary | ICD-10-CM | POA: Diagnosis present

## 2017-01-19 DIAGNOSIS — R0602 Shortness of breath: Secondary | ICD-10-CM

## 2017-01-19 DIAGNOSIS — E785 Hyperlipidemia, unspecified: Secondary | ICD-10-CM

## 2017-01-19 DIAGNOSIS — J329 Chronic sinusitis, unspecified: Secondary | ICD-10-CM | POA: Diagnosis present

## 2017-01-19 DIAGNOSIS — J019 Acute sinusitis, unspecified: Secondary | ICD-10-CM

## 2017-01-19 LAB — ECHOCARDIOGRAM COMPLETE
AOASC: 31 cm
CHL CUP DOP CALC LVOT VTI: 24.1 cm
CHL CUP LV S' LATERAL: 8.05 cm/s
E decel time: 225 msec
FS: 31 % (ref 28–44)
Height: 68 in
IV/PV OW: 1
LA ID, A-P, ES: 25 mm
LA vol A4C: 38.8 ml
LADIAMINDEX: 1.3 cm/m2
LAVOL: 34.1 mL
LAVOLIN: 17.7 mL/m2
LEFT ATRIUM END SYS DIAM: 25 mm
LV PW d: 14 mm — AB (ref 0.6–1.1)
LV TDI E'MEDIAL: 5.66
LVELAT: 9.9 cm/s
LVOT area: 2.84 cm2
LVOT diameter: 19 mm
LVOT peak grad rest: 5 mmHg
LVOT peak vel: 111 cm/s
LVOTSV: 68 mL
Lateral S' vel: 11.3 cm/s
MV Dec: 225
MVPKEVEL: 1.1 m/s
TAPSE: 15.9 mm
TDI e' lateral: 9.9
Weight: 2784 oz

## 2017-01-19 LAB — I-STAT TROPONIN, ED: TROPONIN I, POC: 0 ng/mL (ref 0.00–0.08)

## 2017-01-19 LAB — BASIC METABOLIC PANEL
Anion gap: 9 (ref 5–15)
BUN: 7 mg/dL (ref 6–20)
CHLORIDE: 104 mmol/L (ref 101–111)
CO2: 26 mmol/L (ref 22–32)
CREATININE: 0.67 mg/dL (ref 0.44–1.00)
Calcium: 9.2 mg/dL (ref 8.9–10.3)
GFR calc Af Amer: >60 mL/min (ref >60)
GFR calc non Af Amer: >60 mL/min (ref >60)
Glucose, Bld: 111 mg/dL — ABNORMAL HIGH (ref 65–99)
POTASSIUM: 3.7 mmol/L (ref 3.5–5.1)
Sodium: 139 mmol/L (ref 135–145)

## 2017-01-19 LAB — LIPID PANEL
CHOL/HDL RATIO: 2.9 ratio
CHOLESTEROL: 201 mg/dL — AB (ref 0–200)
HDL: 69 mg/dL (ref >40)
LDL Cholesterol: 119 mg/dL — ABNORMAL HIGH (ref 0–99)
Triglycerides: 66 mg/dL (ref <150)
VLDL: 13 mg/dL (ref 0–40)

## 2017-01-19 LAB — TROPONIN I
Troponin I: <0.03 ng/mL (ref <0.03)
Troponin I: <0.03 ng/mL (ref <0.03)
Troponin I: <0.03 ng/mL (ref <0.03)

## 2017-01-19 LAB — CBC
HCT: 37.7 % (ref 36.0–46.0)
Hemoglobin: 13.1 g/dL (ref 12.0–15.0)
MCH: 33.4 pg (ref 26.0–34.0)
MCHC: 34.7 g/dL (ref 30.0–36.0)
MCV: 96.2 fL (ref 78.0–100.0)
PLATELETS: 236 10*3/uL (ref 150–400)
RBC: 3.92 MIL/uL (ref 3.87–5.11)
RDW: 13.2 % (ref 11.5–15.5)
WBC: 8.1 10*3/uL (ref 4.0–10.5)

## 2017-01-19 LAB — PROTIME-INR
INR: 1.07
PROTHROMBIN TIME: 13.9 s (ref 11.4–15.2)

## 2017-01-19 LAB — TSH: TSH: 5.155 u[IU]/mL — ABNORMAL HIGH (ref 0.350–4.500)

## 2017-01-19 LAB — SEDIMENTATION RATE: SED RATE: 12 mm/h (ref 0–22)

## 2017-01-19 LAB — HEPARIN LEVEL (UNFRACTIONATED): Heparin Unfractionated: 0.63 IU/mL (ref 0.30–0.70)

## 2017-01-19 MED ORDER — HYDROCHLOROTHIAZIDE 12.5 MG PO CAPS
12.5000 mg | ORAL_CAPSULE | Freq: Every day | ORAL | Status: DC
  Administered 2017-01-19: 12.5 mg via ORAL
  Filled 2017-01-19: qty 1

## 2017-01-19 MED ORDER — ONDANSETRON HCL 4 MG/2ML IJ SOLN: 4.0000 mg | Freq: Four times a day (QID) | INTRAMUSCULAR | Status: DC | PRN

## 2017-01-19 MED ORDER — GUAIFENESIN-CODEINE 100-10 MG/5ML PO SOLN: 5.0000 mL | Freq: Four times a day (QID) | ORAL | Status: DC | PRN

## 2017-01-19 MED ORDER — BENZONATATE 100 MG PO CAPS
100.0000 mg | ORAL_CAPSULE | Freq: Three times a day (TID) | ORAL | Status: DC
Start: 2017-01-19 — End: 2017-01-19
  Administered 2017-01-19: 100 mg via ORAL
  Filled 2017-01-19: qty 1

## 2017-01-19 MED ORDER — AMOXICILLIN-POT CLAVULANATE 875-125 MG PO TABS
1.0000 | ORAL_TABLET | Freq: Two times a day (BID) | ORAL | Status: DC
  Administered 2017-01-19: 1 via ORAL
  Filled 2017-01-19 (×2): qty 1

## 2017-01-19 MED ORDER — PANTOPRAZOLE SODIUM 40 MG PO TBEC
40.0000 mg | DELAYED_RELEASE_TABLET | Freq: Every day | ORAL | Status: DC
  Administered 2017-01-19: 40 mg via ORAL
  Filled 2017-01-19: qty 1

## 2017-01-19 MED ORDER — LORAZEPAM 1 MG PO TABS
1.0000 mg | ORAL_TABLET | Freq: Once | ORAL | Status: AC
  Administered 2017-01-19: 1 mg via ORAL
  Filled 2017-01-19: qty 1

## 2017-01-19 MED ORDER — GI COCKTAIL ~~LOC~~: 30.0000 mL | Freq: Four times a day (QID) | ORAL | Status: DC | PRN

## 2017-01-19 MED ORDER — ACETAMINOPHEN 325 MG PO TABS: 650.0000 mg | ORAL_TABLET | ORAL | Status: DC | PRN

## 2017-01-19 MED ORDER — BENZONATATE 100 MG PO CAPS: 100.0000 mg | ORAL_CAPSULE | Freq: Three times a day (TID) | ORAL | 0 refills | Status: DC | PRN

## 2017-01-19 MED ORDER — HYDROCHLOROTHIAZIDE 12.5 MG PO CAPS: 12.5000 mg | ORAL_CAPSULE | Freq: Every day | ORAL | 3 refills | Status: AC

## 2017-01-19 MED ORDER — AMOXICILLIN-POT CLAVULANATE 875-125 MG PO TABS: 1.0000 | ORAL_TABLET | Freq: Two times a day (BID) | ORAL | Status: DC

## 2017-01-19 MED ORDER — PANTOPRAZOLE SODIUM 40 MG PO TBEC: 40.0000 mg | DELAYED_RELEASE_TABLET | Freq: Every day | ORAL | 3 refills | Status: AC

## 2017-01-19 NOTE — Progress Notes (Signed)
Discharge to home, ambulatory,no complaints of any pain or discomfort. D/c instructions and follow up appointments done discussed with patient, verbalized understanding. PIV removed no s/s of infiltration or swelling noted.

## 2017-01-19 NOTE — Progress Notes (Signed)
DAILY PROGRESS NOTE  Subjective:  Feels better today. Very anxious. I personally reviewed her echo and it demonstrates normal LVEF of 60-65%, moderate LVH with grade 2 DD (no hypertensive history), normal wall motion and a trivial to small pericardial effusion. EKG shows lateral scooped ST depression <1 mm, ?repolarization vs, ischemia. Troponin negative x 3 - additional troponin just drawn. She reported symptomatic improvement in her PCP office with GI cocktail immediately. My DDx includes: GERD (most likely), pericarditis (pleuritic pain, EKG changes, pericardial effusion), or much less likely ischemia (risk factor is father with MI in his 53's).   Objective:  Temp:  [98 F (36.7 C)-98.5 F (36.9 C)] 98 F (36.7 C) (03/16 1202) Pulse Rate:  [73-128] 78 (03/16 1202) Resp:  [16-25] 18 (03/16 1202) BP: (118-159)/(58-106) 122/58 (03/16 1202) SpO2:  [95 %-100 %] 97 % (03/16 1202) Weight:  [174 lb (78.9 kg)-178 lb (80.7 kg)] 174 lb (78.9 kg) (03/16 0217) Weight change:   Intake/Output from previous day: 03/15 0701 - 03/16 0700 In: 745.1 [P.O.:200; I.V.:45.1; IV Piggyback:500] Out: -   Intake/Output from this shift: No intake/output data recorded.  Medications: No current facility-administered medications on file prior to encounter.    Current Outpatient Prescriptions on File Prior to Encounter  Medication Sig Dispense Refill  . ibuprofen (ADVIL,MOTRIN) 800 MG tablet Take 1 tablet (800 mg total) by mouth every 8 (eight) hours as needed for moderate pain (mild pain). (Patient not taking: Reported on 01/18/2017) 30 tablet 1  . oxyCODONE-acetaminophen (PERCOCET/ROXICET) 5-325 MG per tablet Take 1-2 tablets by mouth every 4 (four) hours as needed for severe pain (moderate to severe pain (when tolerating fluids)). (Patient not taking: Reported on 01/18/2017) 30 tablet 0    Physical Exam: General appearance: alert and no distress Lungs: clear to auscultation bilaterally Heart: regular  rate and rhythm, S1, S2 normal and no rub Extremities: extremities normal, atraumatic, no cyanosis or edema Neurologic: Grossly normal  Lab Results: Results for orders placed or performed during the hospital encounter of 01/18/17 (from the past 48 hour(s))  Basic metabolic panel     Status: Abnormal   Collection Time: 01/18/17  8:06 PM  Result Value Ref Range   Sodium 140 135 - 145 mmol/L   Potassium 3.7 3.5 - 5.1 mmol/L   Chloride 105 101 - 111 mmol/L   CO2 26 22 - 32 mmol/L   Glucose, Bld 108 (H) 65 - 99 mg/dL   BUN 8 6 - 20 mg/dL   Creatinine, Ser 0.78 0.44 - 1.00 mg/dL   Calcium 9.5 8.9 - 10.3 mg/dL   GFR calc non Af Amer >60 >60 mL/min   GFR calc Af Amer >60 >60 mL/min    Comment: (NOTE) The eGFR has been calculated using the CKD EPI equation. This calculation has not been validated in all clinical situations. eGFR's persistently <60 mL/min signify possible Chronic Kidney Disease.    Anion gap 9 5 - 15  CBC     Status: None   Collection Time: 01/18/17  8:06 PM  Result Value Ref Range   WBC 10.3 4.0 - 10.5 K/uL   RBC 4.24 3.87 - 5.11 MIL/uL   Hemoglobin 14.1 12.0 - 15.0 g/dL   HCT 41.0 36.0 - 46.0 %   MCV 96.7 78.0 - 100.0 fL   MCH 33.3 26.0 - 34.0 pg   MCHC 34.4 30.0 - 36.0 g/dL   RDW 13.1 11.5 - 15.5 %   Platelets 263 150 - 400 K/uL  I-stat troponin, ED     Status: None   Collection Time: 01/18/17  8:23 PM  Result Value Ref Range   Troponin i, poc 0.00 0.00 - 0.08 ng/mL   Comment 3            Comment: Due to the release kinetics of cTnI, a negative result within the first hours of the onset of symptoms does not rule out myocardial infarction with certainty. If myocardial infarction is still suspected, repeat the test at appropriate intervals.   Pregnancy, urine     Status: None   Collection Time: 01/18/17 11:21 PM  Result Value Ref Range   Preg Test, Ur NEGATIVE NEGATIVE    Comment:        THE SENSITIVITY OF THIS METHODOLOGY IS >20 mIU/mL.   I-stat  troponin, ED     Status: None   Collection Time: 01/19/17 12:30 AM  Result Value Ref Range   Troponin i, poc 0.00 0.00 - 0.08 ng/mL   Comment 3            Comment: Due to the release kinetics of cTnI, a negative result within the first hours of the onset of symptoms does not rule out myocardial infarction with certainty. If myocardial infarction is still suspected, repeat the test at appropriate intervals.   Heparin level (unfractionated)     Status: None   Collection Time: 01/19/17  3:40 AM  Result Value Ref Range   Heparin Unfractionated 0.63 0.30 - 0.70 IU/mL    Comment:        IF HEPARIN RESULTS ARE BELOW EXPECTED VALUES, AND PATIENT DOSAGE HAS BEEN CONFIRMED, SUGGEST FOLLOW UP TESTING OF ANTITHROMBIN III LEVELS.   CBC     Status: None   Collection Time: 01/19/17  3:40 AM  Result Value Ref Range   WBC 8.1 4.0 - 10.5 K/uL   RBC 3.92 3.87 - 5.11 MIL/uL   Hemoglobin 13.1 12.0 - 15.0 g/dL   HCT 37.7 36.0 - 46.0 %   MCV 96.2 78.0 - 100.0 fL   MCH 33.4 26.0 - 34.0 pg   MCHC 34.7 30.0 - 36.0 g/dL   RDW 13.2 11.5 - 15.5 %   Platelets 236 150 - 400 K/uL  Troponin I-serum (0, 3, 6 hours)     Status: None   Collection Time: 01/19/17  3:40 AM  Result Value Ref Range   Troponin I <0.03 <0.03 ng/mL  Lipid panel     Status: Abnormal   Collection Time: 01/19/17  3:40 AM  Result Value Ref Range   Cholesterol 201 (H) 0 - 200 mg/dL   Triglycerides 66 <150 mg/dL   HDL 69 >40 mg/dL   Total CHOL/HDL Ratio 2.9 RATIO   VLDL 13 0 - 40 mg/dL   LDL Cholesterol 119 (H) 0 - 99 mg/dL    Comment:        Total Cholesterol/HDL:CHD Risk Coronary Heart Disease Risk Table                     Men   Women  1/2 Average Risk   3.4   3.3  Average Risk       5.0   4.4  2 X Average Risk   9.6   7.1  3 X Average Risk  23.4   11.0        Use the calculated Patient Ratio above and the CHD Risk Table to determine the patient's CHD Risk.  ATP III CLASSIFICATION (LDL):  <100     mg/dL    Optimal  100-129  mg/dL   Near or Above                    Optimal  130-159  mg/dL   Borderline  160-189  mg/dL   High  >190     mg/dL   Very High   Basic metabolic panel     Status: Abnormal   Collection Time: 01/19/17  3:40 AM  Result Value Ref Range   Sodium 139 135 - 145 mmol/L   Potassium 3.7 3.5 - 5.1 mmol/L   Chloride 104 101 - 111 mmol/L   CO2 26 22 - 32 mmol/L   Glucose, Bld 111 (H) 65 - 99 mg/dL   BUN 7 6 - 20 mg/dL   Creatinine, Ser 0.67 0.44 - 1.00 mg/dL   Calcium 9.2 8.9 - 10.3 mg/dL   GFR calc non Af Amer >60 >60 mL/min   GFR calc Af Amer >60 >60 mL/min    Comment: (NOTE) The eGFR has been calculated using the CKD EPI equation. This calculation has not been validated in all clinical situations. eGFR's persistently <60 mL/min signify possible Chronic Kidney Disease.    Anion gap 9 5 - 15  TSH     Status: Abnormal   Collection Time: 01/19/17  3:40 AM  Result Value Ref Range   TSH 5.155 (H) 0.350 - 4.500 uIU/mL    Comment: Performed by a 3rd Generation assay with a functional sensitivity of <=0.01 uIU/mL.  Protime-INR     Status: None   Collection Time: 01/19/17  3:40 AM  Result Value Ref Range   Prothrombin Time 13.9 11.4 - 15.2 seconds   INR 1.07   Troponin I-serum (0, 3, 6 hours)     Status: None   Collection Time: 01/19/17  6:45 AM  Result Value Ref Range   Troponin I <0.03 <0.03 ng/mL  Troponin I-serum (0, 3, 6 hours)     Status: None   Collection Time: 01/19/17  9:44 AM  Result Value Ref Range   Troponin I <0.03 <0.03 ng/mL    Imaging: Dg Chest 2 View  Result Date: 01/18/2017 CLINICAL DATA:  Mid to right chest pain with shortness of breath EXAM: CHEST  2 VIEW COMPARISON:  None. FINDINGS: The heart size and mediastinal contours are within normal limits. Mild atherosclerosis. Both lungs are clear. The visualized skeletal structures are unremarkable. IMPRESSION: No active cardiopulmonary disease. Electronically Signed   By: Donavan Foil M.D.   On:  01/18/2017 20:38   Ct Angio Chest Pe W And/or Wo Contrast  Result Date: 01/19/2017 CLINICAL DATA:  46 year old female with chest pain. EXAM: CT ANGIOGRAPHY CHEST WITH CONTRAST TECHNIQUE: Multidetector CT imaging of the chest was performed using the standard protocol during bolus administration of intravenous contrast. Multiplanar CT image reconstructions and MIPs were obtained to evaluate the vascular anatomy. CONTRAST:  100 cc Isovue 370 COMPARISON:  Chest radiograph dated 01/19/2017 FINDINGS: Cardiovascular: There is no cardiomegaly or pericardial effusion. The thoracic aorta appears unremarkable. The origins of the great vessels of the aortic arch appear patent. There is no CT evidence of pulmonary embolism. Mediastinum/Nodes: There is no hilar or mediastinal adenopathy. The esophagus is grossly unremarkable. No thyroid nodules identified. Lungs/Pleura: The lungs are clear. There is no pleural effusion or pneumothorax. The central airways are patent. Upper Abdomen: The visualized upper abdomen is unremarkable. Musculoskeletal: No chest wall abnormality. No acute  or significant osseous findings. Bilateral breast implants noted. Review of the MIP images confirms the above findings. IMPRESSION: No acute intrathoracic pathology. No CT evidence of pulmonary embolism. Electronically Signed   By: Anner Crete M.D.   On: 01/19/2017 00:19    Assessment:  Principal Problem:   Chest pain Active Problems:   Abnormal EKG   Sinus infection   Dyslipidemia   Plan:  1. I suspect the pain is GERD (she had recent URI and got steroids and antibiotics, symptoms relieved with GI cocktail), however, cannot r/o pericarditis (pericardial effusion, pleuritic pain - but no rub, not positional, ST depression - not elevation) - plan to check ESR and treat accordingly. Coronary ischemia is much less likely with atypical pain - should risk stratify given family history of premature CAD, structural heart disease present  (moderate LVH, AHA-Stage B HF) with grade 2 DD and high filling pressure- recommend starting thiazide diuretic such as HCTZ at discharge. Given her family history of premature CAD, would put her 10-year CAD risk score >7.5%, thus, would recommend goal LDL-C <100. Consider initiating moderate intensity outpatient statin therapy for primary prevention.  Thanks for the consultation. We will arrange for outpatient treadmill stress testing and follow-up with one of our cardiologists in [place] (since they live in [place]).  Time Spent Directly with Patient:  15 minutes  Length of Stay:  LOS: 0 days   Pixie Casino, MD, [facility] Attending Cardiologist [place] 01/19/2017, 12:48 PM

## 2017-01-19 NOTE — Discharge Summary (Addendum)
Physician Discharge Summary   Patient ID: Monica Osborne MRN: [id number] DOB/AGE: 1971/11/02 46 y.o.  Admit date: 01/18/2017 Discharge date: 01/19/2017  Primary Care Physician:  Pcp Not In System  Discharge Diagnoses:    . atypical Chest pain . Sinus infection . Dyslipidemia   Consults:  Cardiology   Recommendations for Outpatient Follow-up:   1. Please repeat CBC/BMET at next visit 2.    DIET:heart healthy diet     Allergies:   Allergies  Allergen Reactions  . Morphine And Related Other (See Comments)    Hallucinations  . Sulfa Antibiotics Nausea And Vomiting     DISCHARGE MEDICATIONS: Current Discharge Medication List    START taking these medications   Details  benzonatate (TESSALON) 100 MG capsule Take 1 capsule (100 mg total) by mouth 3 (three) times daily as needed for cough. Qty: 30 capsule, Refills: 0    hydrochlorothiazide (MICROZIDE) 12.5 MG capsule Take 1 capsule (12.5 mg total) by mouth daily. Qty: 30 capsule, Refills: 3    pantoprazole (PROTONIX) 40 MG tablet Take 1 tablet (40 mg total) by mouth daily at 6 (six) AM. Qty: 30 tablet, Refills: 3      CONTINUE these medications which have NOT CHANGED   Details  amoxicillin-clavulanate (AUGMENTIN) 875-125 MG tablet Take 1 tablet by mouth 2 (two) times daily. For 10 days Refills: 0      STOP taking these medications     ibuprofen (ADVIL,MOTRIN) 800 MG tablet      oxyCODONE-acetaminophen (PERCOCET/ROXICET) 5-325 MG per tablet          Brief H and P: For complete details please refer to admission H and P, but in brief Monica Apsey Libertyis a 46 y.o.femalewith medical history significant ofanxiety and depression; who presents with complaints of right-sided Pleuritic chest pain for last 2 days. Chest pain is associated with shortness of breath and 20 pound weight gain since Christmas. Denies any nausea, vomiting, lightheadedness, palpitations, diaphoresis, or dysuria. Of note  she also had sinus infections, treated with 2 courses of antibiotics, and since last week she has been placed on Augmentin and has 2 more days left to complete course. Patient was sent by PCP to ED due to abnormal EKG. Patient has a significant family history of heart attacks at a young age. Notes her father having his first heart attack at age of 75 and paternal uncle who died from heart attack at age 35.  Hospital Course:   Chest pain Atypical with strong family history - CT angiogram of the chest negative for pulmonary embolism - given possibility of pericarditis given recent viral illness, sinusitis, ESR was obtained, was normal - Troponins were negative x3, patient was started on heparin drip initially with concern for angina - Cardiology consulted, 2-D echo showed EF 01-75%, grade 2 diastolic dysfunction, trivial to small pericardial effusion  - Currently no chest pain. Cardiology recommended outpatient stress test and starting patient on low dose diuretic, started on HCTZ 12.64m daily, PPI      Sinus infection - Continue Augmentin  Day of Discharge BP (!) 122/58 (BP Location: Right Arm)   Pulse 78   Temp 98 F (36.7 C) (Oral)   Resp 18   Ht 5' 8"  (1.727 m)   Wt 78.9 kg (174 lb)   LMP 11/19/2013 Comment: tubal 2000  SpO2 97%   BMI 26.46 kg/m   Physical Exam: General: Alert and awake oriented x3 not in any acute distress. HEENT: anicteric sclera, pupils reactive to light  and accommodation CVS: S1-S2 clear no murmur rubs or gallops Chest: clear to auscultation bilaterally, no wheezing rales or rhonchi Abdomen: soft nontender, nondistended, normal bowel sounds Extremities: no cyanosis, clubbing or edema noted bilaterally Neuro: Cranial nerves II-XII intact, no focal neurological deficits   The results of significant diagnostics from this hospitalization (including imaging, microbiology, ancillary and laboratory) are listed below for reference.    LAB RESULTS: Basic  Metabolic Panel:  Recent Labs Lab 01/18/17 2006 01/19/17 0340  NA 140 139  K 3.7 3.7  CL 105 104  CO2 26 26  GLUCOSE 108* 111*  BUN 8 7  CREATININE 0.78 0.67  CALCIUM 9.5 9.2   Liver Function Tests: No results for input(s): AST, ALT, ALKPHOS, BILITOT, PROT, ALBUMIN in the last 168 hours. No results for input(s): LIPASE, AMYLASE in the last 168 hours. No results for input(s): AMMONIA in the last 168 hours. CBC:  Recent Labs Lab 01/18/17 2006 01/19/17 0340  WBC 10.3 8.1  HGB 14.1 13.1  HCT 41.0 37.7  MCV 96.7 96.2  PLT 263 236   Cardiac Enzymes:  Recent Labs Lab 01/19/17 0645 01/19/17 0944  TROPONINI <0.03 <0.03   BNP: Invalid input(s): POCBNP CBG: No results for input(s): GLUCAP in the last 168 hours.  Significant Diagnostic Studies:  Dg Chest 2 View  Result Date: 01/18/2017 CLINICAL DATA:  Mid to right chest pain with shortness of breath EXAM: CHEST  2 VIEW COMPARISON:  None. FINDINGS: The heart size and mediastinal contours are within normal limits. Mild atherosclerosis. Both lungs are clear. The visualized skeletal structures are unremarkable. IMPRESSION: No active cardiopulmonary disease. Electronically Signed   By: Donavan Foil M.D.   On: 01/18/2017 20:38   Ct Angio Chest Pe W And/or Wo Contrast  Result Date: 01/19/2017 CLINICAL DATA:  46 year old female with chest pain. EXAM: CT ANGIOGRAPHY CHEST WITH CONTRAST TECHNIQUE: Multidetector CT imaging of the chest was performed using the standard protocol during bolus administration of intravenous contrast. Multiplanar CT image reconstructions and MIPs were obtained to evaluate the vascular anatomy. CONTRAST:  100 cc Isovue 370 COMPARISON:  Chest radiograph dated 01/19/2017 FINDINGS: Cardiovascular: There is no cardiomegaly or pericardial effusion. The thoracic aorta appears unremarkable. The origins of the great vessels of the aortic arch appear patent. There is no CT evidence of pulmonary embolism.  Mediastinum/Nodes: There is no hilar or mediastinal adenopathy. The esophagus is grossly unremarkable. No thyroid nodules identified. Lungs/Pleura: The lungs are clear. There is no pleural effusion or pneumothorax. The central airways are patent. Upper Abdomen: The visualized upper abdomen is unremarkable. Musculoskeletal: No chest wall abnormality. No acute or significant osseous findings. Bilateral breast implants noted. Review of the MIP images confirms the above findings. IMPRESSION: No acute intrathoracic pathology. No CT evidence of pulmonary embolism. Electronically Signed   By: Anner Crete M.D.   On: 01/19/2017 00:19    2D ECHO: Study Conclusions  - Left ventricle: The cavity size was normal. Wall thickness was   increased in a pattern of moderate LVH. Systolic function was   normal. The estimated ejection fraction was in the range of 60%   to 65%. Wall motion was normal; there were no regional wall   motion abnormalities. Doppler parameters are consistent with   pseudonormal left ventricular relaxation (grade 2 diastolic   dysfunction). The E/e&' ratio is >15, suggesting elevated LV   filling pressure. - Left atrium: The atrium was normal in size. - Systemic veins: The IVC measures <2.1 cm. but does not  collapse   >50%, suggesting an elevated RA pressure of 8 mmHg. - Pericardium, extracardiac: Trivial to small pericardial effusion.   Features were not consistent with tamponade physiology.  Impressions:  - LVEF 60-65%, moderate LVH, normal wall motion, grade 2 DD with   elevated LV filling pressure, normal LA size, IVC suggests   elevated RA pressure of 8 mmHg, there is a trivial to small   pericardial effusion without tamponade physiology.  Disposition and Follow-up: Discharge Instructions    Diet - low sodium heart healthy    Complete by:  As directed    Increase activity slowly    Complete by:  As directed        DISPOSITION: home    [place]    Carlyle Dolly, MD. Go on 02/05/2017.   Specialty:  Cardiology Why:  @ 11:20am for follow up  Contact information: [place] [address] [place]. Go on 01/30/2017.   Why:  @ 2pm> please wear comfortable outfit and gym shoes Contact information: [address] [place] [address] [phone]           Time spent on Discharge: 76mns   Signed:   Adriyanna Christians M.D. [facility] 01/19/2017, 3:06 PM Pager: [phone]

## 2017-01-19 NOTE — Progress Notes (Signed)
ANTICOAGULATION CONSULT NOTE - Follow Up Consult  Pharmacy Consult for Heparin  Indication: chest pain/ACS  Allergies  Allergen Reactions  . Morphine And Related Other (See Comments)    Hallucinations  . Sulfa Antibiotics Nausea And Vomiting   Patient Measurements: Height: 5\' 8"  (172.7 cm) Weight: 174 lb (78.9 kg) IBW/kg (Calculated) : 63.9  Vital Signs: Temp: 98.1 F (36.7 C) (03/16 0217) Temp Source: Oral (03/16 0217) BP: 131/75 (03/16 0217) Pulse Rate: 88 (03/16 0217)  Labs:  Recent Labs  01/18/17 2006 01/19/17 0340  HGB 14.1 13.1  HCT 41.0 37.7  PLT 263 236  HEPARINUNFRC  --  0.63  CREATININE 0.78 0.67  TROPONINI  --  <0.03    Estimated Creatinine Clearance: 98 mL/min (by C-G formula based on SCr of 0.67 mg/dL).   Assessment: 46 y/o F on heparin for CP, initial heparin level is therapeutic, poss cath today  Goal of Therapy:  Heparin level 0.3-0.7 units/ml Monitor platelets by anticoagulation protocol: Yes   Plan:  -Cont heparin 950 units/hr -1200 HL -F/U cardiology plans  Abran DukeLedford, Adryel Wortmann 01/19/2017,5:11 AM

## 2017-01-19 NOTE — Progress Notes (Signed)
Patient asking for something to help her to sleep, paged on call [facility] to clarify med and NPO orders.

## 2017-01-19 NOTE — Progress Notes (Signed)
   Per patient request, Advanced Directive (AD) documentation left at bedside.  If/when patient ready to move forward with AD, please page on-call chaplain or contact the spiritual care department (9AM - 3PM Mon. - Fri.).  - Rev. Chaplain Kipp Broodnthony Alonnie Bieker MDiv ThM

## 2017-01-19 NOTE — Progress Notes (Signed)
  Echocardiogram 2D Echocardiogram has been performed.  Janalyn HarderWest, Amyre Segundo R 01/19/2017, 11:53 AM

## 2017-01-19 NOTE — Progress Notes (Signed)
[facility]                                                                              Patient Demographics  Monica Osborne, is a 46 y.o. female, DOB - 14-Oct-1971, RUE:[id number]  Admit date - 01/18/2017   Admitting Physician Clydie Braun, MD  Outpatient Primary MD for the patient is Pcp Not In System  Outpatient specialists:   LOS - 0  days    Chief Complaint  Patient presents with  . Chest Pain  . Abnormal ECG       Brief summary   Monica Osborne is a 45 y.o. female with medical history significant of anxiety and depression; who presents with complaints of right-sided Pleuritic chest pain for last 2 days. Chest pain is associated with shortness of breath and 20 pound weight gain since Christmas. Denies any nausea, vomiting, lightheadedness, palpitations, diaphoresis, or dysuria. Of note she also had sinus infections, treated with 2 courses of antibiotics, and since last week she has been placed on Augmentin and has 2 more days left to complete course. Patient was sent by PCP to ED due to abnormal EKG. Patient has a significant family history of heart attacks at a young age. Notes her father having his first heart attack at age of 64 and paternal uncle who died from heart attack at age 41.   Assessment & Plan    Principal Problem:   Chest pain Atypical with strong family history, abnormal EKG - CT angiogram of the chest negative for pulmonary embolism, possibility of pericarditis given recent viral illness, sinusitis - Troponins negative, patient was started on heparin drip - Cardiology consulted, 2-D echo pending - Cardiology considering left heart cath - Currently no chest pain  Active Problems:    Sinus infection - Continue Augmentin  Code Status: Full CODE STATUS DVT Prophylaxis: Heparin drip Family Communication: Discussed in detail with the patient, all imaging results, lab results explained to the patient and husband     Disposition Plan: Awaiting cardiology valuation   Time Spent in minutes  25 minutes  Procedures:  CT angiogram chest  Consultants:   Cardiology  Antimicrobials:   Augmentin   Medications  Scheduled Meds: . amoxicillin-clavulanate  1 tablet Oral BID  . benzonatate  100 mg Oral TID   Continuous Infusions: . heparin 950 Units/hr (01/19/17 0116)   PRN Meds:.acetaminophen, gi cocktail, guaiFENesin-codeine, nitroGLYCERIN, ondansetron (ZOFRAN) IV   Antibiotics   Anti-infectives    Start     Dose/Rate Route Frequency Ordered Stop   01/19/17 1000  amoxicillin-clavulanate (AUGMENTIN) 875-125 MG per tablet 1 tablet  Status:  Discontinued    Comments:  For 10 days     1 tablet Oral 2 times daily 01/19/17 0050 01/19/17 0107   01/19/17 0230  amoxicillin-clavulanate (AUGMENTIN) 875-125 MG per tablet 1 tablet    Comments:  For 10 days     1 tablet Oral 2 times daily 01/19/17 0107          Subjective:   [name] was seen and examined today.  Currently no chest pain or shortness of breath. Patient denies dizziness, abdominal  pain, N/V/D/C, new weakness, numbess, tingling. No acute events overnight.    Objective:   Vitals:   01/19/17 0100 01/19/17 0217 01/19/17 0624 01/19/17 0800  BP: 123/78 131/75 118/75 136/86  Pulse: 84 88 97 73  Resp: (!) 22 16 16 16   Temp:  98.1 F (36.7 C) 98 F (36.7 C) 98.3 F (36.8 C)  TempSrc:  Oral Oral Oral  SpO2: 95% 100% 97% 98%  Weight:  78.9 kg (174 lb)    Height:  5\' 8"  (1.727 m)      Intake/Output Summary (Last 24 hours) at 01/19/17 0944 Last data filed at 01/19/17 0900  Gross per 24 hour  Intake           745.12 ml  Output                0 ml  Net           745.12 ml     Wt Readings from Last 3 Encounters:  01/19/17 78.9 kg (174 lb)  12/02/13 70.3 kg (155 lb)  12/01/13 70.3 kg (155 lb)     Exam  General: Alert and oriented x 3, NAD  HEENT:    Neck: Supple, no JVD, no masses  Cardiovascular: S1 S2  auscultated, no rubs, murmurs or gallops. Regular rate and rhythm.  Respiratory: Clear to auscultation bilaterally, no wheezing, rales or rhonchi  Gastrointestinal: Soft, nontender, nondistended, + bowel sounds  Ext: no cyanosis clubbing or edema  Neuro: AAOx3, Cr N's II- XII. Strength 5/5 upper and lower extremities bilaterally  Skin: No rashes  Psych: Normal affect and demeanor, alert and oriented x3    Data Reviewed:  I have personally reviewed following labs and imaging studies  Micro Results No results found for this or any previous visit (from the past 240 hour(s)).  Radiology Reports Dg Chest 2 View  Result Date: 01/18/2017 CLINICAL DATA:  Mid to right chest pain with shortness of breath EXAM: CHEST  2 VIEW COMPARISON:  None. FINDINGS: The heart size and mediastinal contours are within normal limits. Mild atherosclerosis. Both lungs are clear. The visualized skeletal structures are unremarkable. IMPRESSION: No active cardiopulmonary disease. Electronically Signed   By: Jasmine PangKim  Fujinaga M.D.   On: 01/18/2017 20:38   Ct Angio Chest Pe W And/or Wo Contrast  Result Date: 01/19/2017 CLINICAL DATA:  46 year old female with chest pain. EXAM: CT ANGIOGRAPHY CHEST WITH CONTRAST TECHNIQUE: Multidetector CT imaging of the chest was performed using the standard protocol during bolus administration of intravenous contrast. Multiplanar CT image reconstructions and MIPs were obtained to evaluate the vascular anatomy. CONTRAST:  100 cc Isovue 370 COMPARISON:  Chest radiograph dated 01/19/2017 FINDINGS: Cardiovascular: There is no cardiomegaly or pericardial effusion. The thoracic aorta appears unremarkable. The origins of the great vessels of the aortic arch appear patent. There is no CT evidence of pulmonary embolism. Mediastinum/Nodes: There is no hilar or mediastinal adenopathy. The esophagus is grossly unremarkable. No thyroid nodules identified. Lungs/Pleura: The lungs are clear. There is no  pleural effusion or pneumothorax. The central airways are patent. Upper Abdomen: The visualized upper abdomen is unremarkable. Musculoskeletal: No chest wall abnormality. No acute or significant osseous findings. Bilateral breast implants noted. Review of the MIP images confirms the above findings. IMPRESSION: No acute intrathoracic pathology. No CT evidence of pulmonary embolism. Electronically Signed   By: Elgie CollardArash  Radparvar M.D.   On: 01/19/2017 00:19    Lab Data:  CBC:  Recent Labs Lab 01/18/17 2006 01/19/17 0340  WBC 10.3 8.1  HGB 14.1 13.1  HCT 41.0 37.7  MCV 96.7 96.2  PLT 263 236   Basic Metabolic Panel:  Recent Labs Lab 01/18/17 2006 01/19/17 0340  NA 140 139  K 3.7 3.7  CL 105 104  CO2 26 26  GLUCOSE 108* 111*  BUN 8 7  CREATININE 0.78 0.67  CALCIUM 9.5 9.2   GFR: Estimated Creatinine Clearance: 98 mL/min (by C-G formula based on SCr of 0.67 mg/dL). Liver Function Tests: No results for input(s): AST, ALT, ALKPHOS, BILITOT, PROT, ALBUMIN in the last 168 hours. No results for input(s): LIPASE, AMYLASE in the last 168 hours. No results for input(s): AMMONIA in the last 168 hours. Coagulation Profile:  Recent Labs Lab 01/19/17 0340  INR 1.07   Cardiac Enzymes:  Recent Labs Lab 01/19/17 0340 01/19/17 0645  TROPONINI <0.03 <0.03   BNP (last 3 results) No results for input(s): PROBNP in the last 8760 hours. HbA1C: No results for input(s): HGBA1C in the last 72 hours. CBG: No results for input(s): GLUCAP in the last 168 hours. Lipid Profile:  Recent Labs  01/19/17 0340  CHOL 201*  HDL 69  LDLCALC 119*  TRIG 66  CHOLHDL 2.9   Thyroid Function Tests:  Recent Labs  01/19/17 0340  TSH 5.155*   Anemia Panel: No results for input(s): VITAMINB12, FOLATE, FERRITIN, TIBC, IRON, RETICCTPCT in the last 72 hours. Urine analysis:    Component Value Date/Time   COLORURINE ORANGE 09/16/2013 1216   APPEARANCEUR HAZY 09/16/2013 1216   LABSPEC SEE  COMMENT 09/16/2013 1216   PHURINE see comment 09/16/2013 1216   GLUCOSEU see comment 09/16/2013 1216   HGBUR see comment 09/16/2013 1216   BILIRUBINUR SEE COMMENT 09/16/2013 1216   KETONESUR SEE COMMENT 09/16/2013 1216   PROTEINUR SEE COMMENT 09/16/2013 1216   NITRITE SEE COMMENT 09/16/2013 1216   LEUKOCYTESUR SEE COMMENT 09/16/2013 1216     Bonney Berres M.D. [facility] 01/19/2017, 9:44 AM  Pager: [phone] Between 7am to 7pm - call Pager - [phone]  After 7pm go to [email or web address] - password [email or web address]  Call night coverage person covering after 7pm

## 2017-01-19 NOTE — Progress Notes (Signed)
Patient arrived to unit, ambulated to bed, arrived with a heparin drip going at 9.735ml/hr. Patient oriented to unit, call bell within reach. CCMD called, family at bedside.

## 2017-02-02 ENCOUNTER — Ambulatory Visit (INDEPENDENT_AMBULATORY_CARE_PROVIDER_SITE_OTHER): Payer: Commercial Managed Care - PPO

## 2017-02-02 DIAGNOSIS — R079 Chest pain, unspecified: Secondary | ICD-10-CM

## 2017-02-02 LAB — EXERCISE TOLERANCE TEST
CHL CUP STRESS STAGE 1 DBP: 104 mmHg
CHL CUP STRESS STAGE 1 GRADE: 0 %
CHL CUP STRESS STAGE 1 HR: 100 {beats}/min
CHL CUP STRESS STAGE 1 SBP: 134 mmHg
CHL CUP STRESS STAGE 2 SPEED: 0 mph
CHL CUP STRESS STAGE 3 GRADE: 0 %
CHL CUP STRESS STAGE 4 GRADE: 0.1 %
CHL CUP STRESS STAGE 4 SPEED: 1 mph
CHL CUP STRESS STAGE 5 GRADE: 10 %
CHL CUP STRESS STAGE 5 HR: 141 {beats}/min
CHL CUP STRESS STAGE 6 SBP: 191 mmHg
CHL CUP STRESS STAGE 6 SPEED: 2.5 mph
CHL CUP STRESS STAGE 8 HR: 136 {beats}/min
CHL CUP STRESS STAGE 8 SPEED: 0 mph
CHL CUP STRESS STAGE 9 DBP: 89 mmHg
CHL CUP STRESS STAGE 9 GRADE: 0 %
CHL CUP STRESS STAGE 9 HR: 103 {beats}/min
CSEPPMHR: 97 %
Estimated workload: 8.5 METS
Exercise duration (min): 7 min
Exercise duration (sec): 2 s
MPHR: 175 {beats}/min
Peak HR: 171 {beats}/min
Percent HR: 97 %
RPE: 17
Rest HR: 93 {beats}/min
Stage 1 Speed: 0 mph
Stage 2 Grade: 0 %
Stage 2 HR: 100 {beats}/min
Stage 3 HR: 100 {beats}/min
Stage 3 Speed: 1 mph
Stage 4 HR: 100 {beats}/min
Stage 5 DBP: 91 mmHg
Stage 5 SBP: 177 mmHg
Stage 5 Speed: 1.7 mph
Stage 6 DBP: 88 mmHg
Stage 6 Grade: 12 %
Stage 6 HR: 160 {beats}/min
Stage 7 Grade: 14 %
Stage 7 HR: 171 {beats}/min
Stage 7 Speed: 3.4 mph
Stage 8 DBP: 82 mmHg
Stage 8 Grade: 0 %
Stage 8 SBP: 180 mmHg
Stage 9 SBP: 131 mmHg
Stage 9 Speed: 0 mph

## 2017-02-05 ENCOUNTER — Ambulatory Visit (INDEPENDENT_AMBULATORY_CARE_PROVIDER_SITE_OTHER): Payer: Commercial Managed Care - PPO | Admitting: Cardiology

## 2017-02-05 ENCOUNTER — Encounter: Payer: Self-pay | Admitting: Cardiology

## 2017-02-05 ENCOUNTER — Encounter: Payer: Self-pay | Admitting: *Deleted

## 2017-02-05 VITALS — BP 126/81 | HR 103 | Ht 68.0 in | Wt 180.6 lb

## 2017-02-05 DIAGNOSIS — R079 Chest pain, unspecified: Secondary | ICD-10-CM | POA: Diagnosis not present

## 2017-02-05 NOTE — Progress Notes (Signed)
Clinical Summary Monica Osborne is a 46 y.o.female seen today for follow up of the following medical problems. This is our first visit together, she was seen in consultation by our group during recent admission.    1. Chest pain - aching pain midhcest, 6/10. Occur at rest or with exertion. +SOB. Can have some palpitations. Pains last about 5-6 minutes. Worst with deep breaths, pain down right.  - admission 01/18/17 with chest pain - described as atypical symptoms on admission.  - CT PE negative. Troponin neg x 3, normal ESR. - 01/2017 echo LVEF 60-65%, no WMAs, grade II diastolic dysfunction, small pericardial effusion - 02/02/17 GXT 1 mm down sloping depression lateral leads.  - still with chest soreness at times.  - CAD risk factors; HTN, father MIs 67 and had CABG.    Past Medical History:  Diagnosis Date  . Anxiety and depression   . Pelvic pain in female      Allergies  Allergen Reactions  . Morphine And Related Other (See Comments)    Hallucinations  . Sulfa Antibiotics Nausea And Vomiting     Current Outpatient Prescriptions  Medication Sig Dispense Refill  . amoxicillin-clavulanate (AUGMENTIN) 875-125 MG tablet Take 1 tablet by mouth 2 (two) times daily. For 10 days  0  . benzonatate (TESSALON) 100 MG capsule Take 1 capsule (100 mg total) by mouth 3 (three) times daily as needed for cough. 30 capsule 0  . hydrochlorothiazide (MICROZIDE) 12.5 MG capsule Take 1 capsule (12.5 mg total) by mouth daily. 30 capsule 3  . pantoprazole (PROTONIX) 40 MG tablet Take 1 tablet (40 mg total) by mouth daily at 6 (six) AM. 30 tablet 3   No current facility-administered medications for this visit.      Past Surgical History:  Procedure Laterality Date  . ABDOMINAL HYSTERECTOMY    . BREAST ENHANCEMENT SURGERY    . LAPAROSCOPIC ASSISTED VAGINAL HYSTERECTOMY N/A 12/02/2013   Procedure: LAPAROSCOPIC ASSISTED VAGINAL HYSTERECTOMY;  Surgeon: Margarette Asal, MD;  Location: [facility];   Service: Gynecology;  Laterality: N/A;  . LAPAROSCOPY N/A 11/24/2013   Procedure: LAPAROSCOPY DIAGNOSTIC;  Surgeon: Margarette Asal, MD;  Location: [facility];  Service: Gynecology;  Laterality: N/A;  . NASAL SINUS SURGERY    . SALPINGOOPHORECTOMY Bilateral 12/02/2013   Procedure: SALPINGO OOPHORECTOMY;  Surgeon: Margarette Asal, MD;  Location: [facility];  Service: Gynecology;  Laterality: Bilateral;  . TUBAL LIGATION  2007     Allergies  Allergen Reactions  . Morphine And Related Other (See Comments)    Hallucinations  . Sulfa Antibiotics Nausea And Vomiting      Family history Father with MI in his 16s   Social History Ms. Menor reports that she has never smoked. She has never used smokeless tobacco. Ms. Coyle reports that she drinks alcohol.   Review of Systems CONSTITUTIONAL: No weight loss, fever, chills, weakness or fatigue.  HEENT: Eyes: No visual loss, blurred vision, double vision or yellow sclerae.No hearing loss, sneezing, congestion, runny nose or sore throat.  SKIN: No rash or itching.  CARDIOVASCULAR: per HPI RESPIRATORY: No shortness of breath, cough or sputum.  GASTROINTESTINAL: No anorexia, nausea, vomiting or diarrhea. No abdominal pain or blood.  GENITOURINARY: No burning on urination, no polyuria NEUROLOGICAL: No headache, dizziness, syncope, paralysis, ataxia, numbness or tingling in the extremities. No change in bowel or bladder control.  MUSCULOSKELETAL: No muscle, back pain, joint pain or stiffness.  LYMPHATICS: No enlarged nodes. No  history of splenectomy.  PSYCHIATRIC: No history of depression or anxiety.  ENDOCRINOLOGIC: No reports of sweating, cold or heat intolerance. No polyuria or polydipsia.  Marland Kitchen   Physical Examination Vitals:   02/05/17 1104 02/05/17 1109  BP: 121/86 126/81  Pulse: 95 (!) 103   Vitals:   02/05/17 1104  Weight: 180 lb 9.6 oz (81.9 kg)  Height: 5' 8"  (1.727 m)    Gen: resting comfortably, no  acute distress HEENT: no scleral icterus, pupils equal round and reactive, no palptable cervical adenopathy,  CV: RRR, no m/r/g ,no jvd Resp: Clear to auscultation bilaterally GI: abdomen is soft, non-tender, non-distended, normal bowel sounds, no hepatosplenomegaly MSK: extremities are warm, no edema.  Skin: warm, no rash Neuro:  no focal deficits Psych: appropriate affect   Diagnostic Studies - 01/2017 echo LVEF 60-65%, no WMAs, grade II diastolic dysfunction, small pericardial effusion - 02/02/17 GXT 1 mm down sloping depression lateral leads.     Assessment and Plan  1. Chest pain - symptoms somewhat atypical. Strong family history of CAD. Recent GXT with 70m ST depressions - we will obtain stress echo to further evaluate - start ASA 843mdaily   F/u pending stress results   JoArnoldo LenisM.D.

## 2017-02-05 NOTE — Patient Instructions (Addendum)
Medication Instructions:  Continue all current medications.  Labwork: none  Testing/Procedures:  Your physician has requested that you have a stress echocardiogram. For further information please visit [email or web address] Please follow instruction sheet as given.  Office will contact with results via phone or letter.    Follow-Up: Pending test results   Any Other Special Instructions Will Be Listed Below (If Applicable).  If you need a refill on your cardiac medications before your next appointment, please call your pharmacy.

## 2017-02-06 ENCOUNTER — Telehealth: Payer: Self-pay | Admitting: Cardiology

## 2017-02-06 NOTE — Telephone Encounter (Signed)
Pre-cert Verification for the following procedure   Stress Echo scheduled for 02-23-17 at [facility]

## 2017-02-08 ENCOUNTER — Observation Stay (HOSPITAL_COMMUNITY)
Admission: EM | Admit: 2017-02-08 | Discharge: 2017-02-09 | Disposition: A | Discharge status: Home / Self Care | Payer: Commercial Managed Care - PPO | Attending: Cardiology | Admitting: Cardiology

## 2017-02-08 ENCOUNTER — Encounter (HOSPITAL_COMMUNITY): Payer: Self-pay

## 2017-02-08 ENCOUNTER — Emergency Department (HOSPITAL_COMMUNITY): Payer: Commercial Managed Care - PPO

## 2017-02-08 ENCOUNTER — Other Ambulatory Visit: Payer: Self-pay

## 2017-02-08 DIAGNOSIS — R079 Chest pain, unspecified: Principal | ICD-10-CM

## 2017-02-08 DIAGNOSIS — I5032 Chronic diastolic (congestive) heart failure: Secondary | ICD-10-CM | POA: Diagnosis not present

## 2017-02-08 DIAGNOSIS — Z8249 Family history of ischemic heart disease and other diseases of the circulatory system: Secondary | ICD-10-CM | POA: Insufficient documentation

## 2017-02-08 DIAGNOSIS — K219 Gastro-esophageal reflux disease without esophagitis: Secondary | ICD-10-CM | POA: Diagnosis not present

## 2017-02-08 DIAGNOSIS — I493 Ventricular premature depolarization: Secondary | ICD-10-CM | POA: Diagnosis not present

## 2017-02-08 DIAGNOSIS — J329 Chronic sinusitis, unspecified: Secondary | ICD-10-CM | POA: Diagnosis not present

## 2017-02-08 DIAGNOSIS — Z7982 Long term (current) use of aspirin: Secondary | ICD-10-CM | POA: Diagnosis not present

## 2017-02-08 DIAGNOSIS — Z79899 Other long term (current) drug therapy: Secondary | ICD-10-CM | POA: Diagnosis not present

## 2017-02-08 DIAGNOSIS — E876 Hypokalemia: Secondary | ICD-10-CM | POA: Diagnosis not present

## 2017-02-08 DIAGNOSIS — I110 Hypertensive heart disease with heart failure: Secondary | ICD-10-CM | POA: Insufficient documentation

## 2017-02-08 DIAGNOSIS — I200 Unstable angina: Secondary | ICD-10-CM

## 2017-02-08 LAB — BASIC METABOLIC PANEL
ANION GAP: 8 (ref 5–15)
BUN: 9 mg/dL (ref 6–20)
CHLORIDE: 99 mmol/L — AB (ref 101–111)
CO2: 30 mmol/L (ref 22–32)
CREATININE: 0.85 mg/dL (ref 0.44–1.00)
Calcium: 9.8 mg/dL (ref 8.9–10.3)
GFR calc non Af Amer: >60 mL/min (ref >60)
Glucose, Bld: 126 mg/dL — ABNORMAL HIGH (ref 65–99)
Potassium: 3.7 mmol/L (ref 3.5–5.1)
SODIUM: 137 mmol/L (ref 135–145)

## 2017-02-08 LAB — CBC
HCT: 38.3 % (ref 36.0–46.0)
HEMOGLOBIN: 13.5 g/dL (ref 12.0–15.0)
MCH: 34.1 pg — ABNORMAL HIGH (ref 26.0–34.0)
MCHC: 35.2 g/dL (ref 30.0–36.0)
MCV: 96.7 fL (ref 78.0–100.0)
PLATELETS: 269 10*3/uL (ref 150–400)
RBC: 3.96 MIL/uL (ref 3.87–5.11)
RDW: 12.9 % (ref 11.5–15.5)
WBC: 9.9 10*3/uL (ref 4.0–10.5)

## 2017-02-08 LAB — PROTIME-INR
INR: 0.98
PROTHROMBIN TIME: 13 s (ref 11.4–15.2)

## 2017-02-08 LAB — TROPONIN I

## 2017-02-08 MED ORDER — ZOLPIDEM TARTRATE 5 MG PO TABS: 5.0000 mg | ORAL_TABLET | Freq: Every evening | ORAL | Status: DC | PRN

## 2017-02-08 MED ORDER — ENOXAPARIN SODIUM 40 MG/0.4ML ~~LOC~~ SOLN
40.0000 mg | SUBCUTANEOUS | Status: DC
  Administered 2017-02-08: 40 mg via SUBCUTANEOUS
  Filled 2017-02-08: qty 0.4

## 2017-02-08 MED ORDER — SODIUM CHLORIDE 0.9% FLUSH: 3.0000 mL | INTRAVENOUS | Status: DC | PRN

## 2017-02-08 MED ORDER — ALPRAZOLAM 0.25 MG PO TABS
0.2500 mg | ORAL_TABLET | Freq: Two times a day (BID) | ORAL | Status: DC | PRN
  Administered 2017-02-09: 0.25 mg via ORAL
  Filled 2017-02-08: qty 1

## 2017-02-08 MED ORDER — ONDANSETRON HCL 4 MG/2ML IJ SOLN: 4.0000 mg | Freq: Four times a day (QID) | INTRAMUSCULAR | Status: DC | PRN

## 2017-02-08 MED ORDER — SODIUM CHLORIDE 0.9 % WEIGHT BASED INFUSION
1.0000 mL/kg/h | INTRAVENOUS | Status: DC
  Administered 2017-02-09: 1 mL/kg/h via INTRAVENOUS

## 2017-02-08 MED ORDER — FLUTICASONE PROPIONATE 50 MCG/ACT NA SUSP
1.0000 | Freq: Every day | NASAL | Status: DC
  Administered 2017-02-09: 1 via NASAL
  Filled 2017-02-08: qty 16

## 2017-02-08 MED ORDER — SODIUM CHLORIDE 0.9% FLUSH
3.0000 mL | Freq: Two times a day (BID) | INTRAVENOUS | Status: DC
  Administered 2017-02-09: 3 mL via INTRAVENOUS

## 2017-02-08 MED ORDER — ASPIRIN 81 MG PO CHEW
324.0000 mg | CHEWABLE_TABLET | ORAL | Status: AC
  Administered 2017-02-09: 324 mg via ORAL
  Filled 2017-02-08: qty 4

## 2017-02-08 MED ORDER — SODIUM CHLORIDE 0.9 % WEIGHT BASED INFUSION
3.0000 mL/kg/h | INTRAVENOUS | Status: DC
  Administered 2017-02-09: 3 mL/kg/h via INTRAVENOUS

## 2017-02-08 MED ORDER — ACETAMINOPHEN 325 MG PO TABS
650.0000 mg | ORAL_TABLET | ORAL | Status: DC | PRN
  Administered 2017-02-09: 650 mg via ORAL
  Filled 2017-02-08: qty 2

## 2017-02-08 MED ORDER — PANTOPRAZOLE SODIUM 40 MG PO TBEC
40.0000 mg | DELAYED_RELEASE_TABLET | Freq: Every day | ORAL | Status: DC
  Administered 2017-02-09: 40 mg via ORAL
  Filled 2017-02-08: qty 1

## 2017-02-08 MED ORDER — ASPIRIN EC 81 MG PO TBEC: 81.0000 mg | DELAYED_RELEASE_TABLET | Freq: Every day | ORAL | Status: DC

## 2017-02-08 MED ORDER — NITROGLYCERIN 0.4 MG SL SUBL: 0.4000 mg | SUBLINGUAL_TABLET | SUBLINGUAL | Status: DC | PRN

## 2017-02-08 MED ORDER — SODIUM CHLORIDE 0.9 % IV SOLN: 250.0000 mL | INTRAVENOUS | Status: DC | PRN

## 2017-02-08 MED ORDER — METOPROLOL TARTRATE 25 MG PO TABS
25.0000 mg | ORAL_TABLET | Freq: Two times a day (BID) | ORAL | Status: DC
Start: 2017-02-08 — End: 2017-02-09
  Administered 2017-02-08 – 2017-02-09 (×2): 25 mg via ORAL
  Filled 2017-02-08 (×2): qty 1

## 2017-02-08 NOTE — ED Triage Notes (Signed)
Patient reports of left sided chest pain that started today while at work.  States her job is fast paced but not strenuous.  Patient reviewed 61 baby by EMS, patient is now pain free.  Had recent stress test.

## 2017-02-08 NOTE — ED Provider Notes (Signed)
[facility]ER[facility]Y DEPT Provider Note   CSN: [id number] Arrival date & time: 02/08/17  1410     History   Chief Complaint Chief Complaint  Patient presents with  . Chest Pain    HPI Monica Osborne is a 46 y.o. female.  Pt w recent admission in March 2018 for chest pain, reports to ED w acute onset CP this morning at 10:30am w radiation to L chest. She describes pain as 2-3/10 severity, improved w NTG per EMS. No assoc diaphoresis, nausea, jaw or back pain. Pt also reports anxiety regarding recent cardiology workup. Per chart, pt had abnl exercise stress test her primary cardiologist is Dr. Wyline Mood. Future appointment for stress echo on April 20. Recent CTA chest was normal and cardiac echo showed nl EF.       Past Medical History:  Diagnosis Date  . Anxiety and depression   . Pelvic pain in female     Patient Active Problem List   Diagnosis Date Noted  . Chest pain 01/19/2017  . Abnormal EKG 01/19/2017  . Sinus infection 01/19/2017  . Dyslipidemia 01/19/2017  . Shortness of breath   . Gastroesophageal reflux disease   . Endometriosis 12/03/2013  . Pelvic pain 12/02/2013    Past Surgical History:  Procedure Laterality Date  . ABDOMINAL HYSTERECTOMY    . BREAST ENHANCEMENT SURGERY    . LAPAROSCOPIC ASSISTED VAGINAL HYSTERECTOMY N/A 12/02/2013   Procedure: LAPAROSCOPIC ASSISTED VAGINAL HYSTERECTOMY;  Surgeon: Meriel Pica, MD;  Location: [facility];  Service: Gynecology;  Laterality: N/A;  . LAPAROSCOPY N/A 11/24/2013   Procedure: LAPAROSCOPY DIAGNOSTIC;  Surgeon: Meriel Pica, MD;  Location: [facility];  Service: Gynecology;  Laterality: N/A;  . NASAL SINUS SURGERY    . SALPINGOOPHORECTOMY Bilateral 12/02/2013   Procedure: SALPINGO OOPHORECTOMY;  Surgeon: Meriel Pica, MD;  Location: [facility];  Service: Gynecology;  Laterality: Bilateral;  . TUBAL LIGATION  2007    OB History    No data available       Home Medications    Prior to  Admission medications   Medication Sig Start Date End Date Taking? Authorizing Provider  hydrochlorothiazide (MICROZIDE) 12.5 MG capsule Take 1 capsule (12.5 mg total) by mouth daily. 01/19/17   Ripudeep Jenna Luo, MD  pantoprazole (PROTONIX) 40 MG tablet Take 1 tablet (40 mg total) by mouth daily at 6 (six) AM. 01/20/17   Ripudeep Jenna Luo, MD    Family History Family History  Problem Relation Age of Onset  . Heart attack Father   . Heart disease Father   . Heart failure Father   . Hypertension Father   . Heart attack Paternal Uncle   . Bladder Cancer Maternal Grandmother   . Heart attack Maternal Grandfather   . Heart disease Paternal Grandmother     Social History Social History  Substance Use Topics  . Smoking status: Never Smoker  . Smokeless tobacco: Never Used  . Alcohol use 0.0 oz/week     Comment: socially     Allergies   Morphine and related and Sulfa antibiotics   Review of Systems Review of Systems  Constitutional: Negative for chills, diaphoresis and fever.  Respiratory: Negative for choking.   Cardiovascular: Positive for chest pain (mid-sternal pain and pressure radiating to L chest). Negative for leg swelling.  Gastrointestinal: Negative for abdominal pain, nausea and vomiting.  Musculoskeletal: Negative for back pain and neck pain.  Neurological: Positive for headaches (after NTG). Negative for light-headedness.  Psychiatric/Behavioral: The  patient is nervous/anxious.   All other systems reviewed and are negative.    Physical Exam Updated Vital Signs BP 99/87   Pulse 100   Temp 98.6 F (37 C) (Oral)   Resp 19   Ht  (1.727 m)   Wt 81.6 kg   LMP 11/19/2013 Comment: tubal 2000  SpO2 100%   BMI 27.37 kg/m   Physical Exam  Constitutional: She is oriented to person, place, and time. She appears well-developed and well-nourished. No distress.  HENT:  Head: Normocephalic and atraumatic.  Eyes: Conjunctivae are normal.  Neck: Normal range of  motion.  Cardiovascular: Normal rate, regular rhythm, normal heart sounds and intact distal pulses.  Exam reveals no gallop and no friction rub.   No murmur heard. Pulmonary/Chest: Effort normal and breath sounds normal. No respiratory distress. She has no wheezes. She has no rales. She exhibits no tenderness.  Abdominal: Soft. Bowel sounds are normal. She exhibits no distension and no mass. There is no tenderness. There is no guarding.  Musculoskeletal: Normal range of motion.  Neurological: She is alert and oriented to person, place, and time.  Skin: Skin is warm and dry.  Psychiatric: She has a normal mood and affect. Her behavior is normal.  Nursing note and vitals reviewed.    ED Treatments / Results  Labs (all labs ordered are listed, but only abnormal results are displayed) Labs Reviewed  BASIC METABOLIC PANEL - Abnormal; Notable for the following:       Result Value   Chloride 99 (*)    Glucose, Bld 126 (*)    All other components within normal limits  CBC - Abnormal; Notable for the following:    MCH 34.1 (*)    All other components within normal limits  TROPONIN I    EKG  EKG Interpretation  Date/Time:  Thursday February 08 2017 14:17:34 EDT Ventricular Rate:  96 PR Interval:    QRS Duration: 90 QT Interval:  330 QTC Calculation: 417 R Axis:   80 Text Interpretation:  Sinus rhythm Low voltage, precordial leads Borderline repolarization abnormality Non-specific ST-t changes since last tracing no significant change  from 01/18/17 Confirmed by MILLER  MD, BRIAN ([id number]) on 02/08/2017 2:25:49 PM       Radiology Dg Chest 2 View  Result Date: 02/08/2017 CLINICAL DATA:  Left-sided chest pain EXAM: CHEST  2 VIEW COMPARISON:  01/18/2017 FINDINGS: The heart size and mediastinal contours are within normal limits. Both lungs are clear. The visualized skeletal structures are unremarkable. IMPRESSION: No active cardiopulmonary disease. Electronically Signed   By: Signa Kell M.D.    On: 02/08/2017 14:46    Procedures Procedures (including critical care time)  Medications Ordered in ED Medications - No data to display   Initial Impression / Assessment and Plan / ED Course  I have reviewed the triage vital signs and the nursing notes.  Pertinent labs & imaging results that were available during my care of the patient were reviewed by me and considered in my medical decision making (see chart for details).     Pt w unstable angina. Chest pain began this morning; mid-sternal radiating to L chest, no assoc diaphoresis or nausea. Improved w NTG. EKG wnl, CXR wnl, CMP wnl, CBC wnl. Initial trop neg, trop #2 pending. Recent abnormal exercise stress test and future appt for stress echo. Pt followed by cardiologist Dr. Wyline Mood; Dr. Hyacinth Meeker spoke w his partner Dr. Eden Emms and he recommends transfer to [facility] for admission and  further workup. Trish w cardiology team at [facility] to arrange for a bed. Pt hemodynamically stable, VSS, safe for transport to [facility].  Patient discussed with and seen by Dr. Hyacinth Meeker. Discussed results, findings,and plan for transfer. Patient verbalized understanding and agreed with plan.    Final Clinical Impressions(s) / ED Diagnoses   Final diagnoses:  Unstable angina [facility])    New Prescriptions New Prescriptions   No medications on file     [place] N Russo, PA-C 02/08/17 1558    Eber Hong, MD 02/09/17 [phone]

## 2017-02-08 NOTE — ED Notes (Signed)
ED Provider at bedside. 

## 2017-02-08 NOTE — ED Notes (Signed)
Cardiology at bedside.

## 2017-02-08 NOTE — ED Notes (Signed)
CAREL[facility] HERE FOR TRANSPORT.

## 2017-02-08 NOTE — ED Provider Notes (Signed)
The patient is a 46 year old female with a recently diagnosed history of hypertension who was admitted to the hospital last month for chest pain and an abnormal EKG. During that timeframe she had a stress test which showed some slight ST segment depression in several leads and it was arranged to have an outpatient stress echo to be performed on April 20. The patient today developed increasing chest discomfort while she was at work with some mild exertion, she received 1 nitroglycerin by EMS which gave her a headache that resolved her chest pain completely. She had no coughing, no shortness of breath, no radiation of the pain, no swelling of the legs.  On exam the patient appears mildly anxious, she is mildly tachycardic at 100 bpm, has no peripheral edema or JVD and has clear heart and lung sounds without murmurs rubs or gallops.  EKG reviewed, I personally interpreted this EKG and find it to be unchanged though abnormal.   EKG Interpretation  Date/Time:  Thursday February 08 2017 14:17:34 EDT Ventricular Rate:  96 PR Interval:    QRS Duration: 90 QT Interval:  330 QTC Calculation: 417 R Axis:   80 Text Interpretation:  Sinus rhythm Low voltage, precordial leads Borderline repolarization abnormality Non-specific ST-t changes since last tracing no significant change  from 01/18/17 Confirmed by Mayli Covington  MD, Kelso Bibby ([id number]) on 02/08/2017 2:25:49 PM        Chest x-ray reviewed, labs reviewed, no findings for acute myocardial infarction other than her persistently abnormal EKG. I discussed the patient's results with Dr. Eden Emms - he was seen the patient in consultation prior to transfer and requested that the patient be transferred to [facility]. The patient is pain-free and symptom-free at this time  I discussed the care with Trish who will arrange for bed at [facility] - Dr. Anne Fu will be accepting physician  Medical screening examination/treatment/procedure(s) were conducted as a shared visit with  non-physician practitioner(s) and myself.  I personally evaluated the patient during the encounter.  Clinical Impression:   Final diagnoses:  Unstable angina (HCC)         Eber Hong, MD 02/09/17 [phone]

## 2017-02-08 NOTE — H&P (Signed)
Physician History and Physical     Patient ID: Monica Osborne MRN: [id number] DOB/AGE: [birth year] 46 y.o. Admit date: 02/08/2017  Primary Care Physician: [facility] Primary Cardiologist: Branch  Active Problems:   Chest pain   HPI:  46 y.o. seen in ER for recurrent chest pain. Initially seen in ER 01/18/17 for same and r/o Saw Dr Wyline Mood in f/u CT negative for PE ECG normal negative troponins x 3 He ordered an ETT. This was read as positive and she was to have a stress echo on 02/23/17  I reviewed her ETT and felt it was non-diagnostic due to baseline biphasic T waves in lateral leads. 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 Pain worse than before did have some relief with nitro given by EMS on way to ER. Currently pain free but scared and teary eyed. Dad had stent age 71 and CABG soon after that.   Review of systems complete and found to be negative unless listed above   Past Medical History:  Diagnosis Date  . Anxiety and depression   . Pelvic pain in female     Family History  Problem Relation Age of Onset  . Heart attack Father   . Heart disease Father   . Heart failure Father   . Hypertension Father   . Heart attack Paternal Uncle   . Bladder Cancer Maternal Grandmother   . Heart attack Maternal Grandfather   . Heart disease Paternal Grandmother     Social History   Social History  . Marital status: Single    Spouse name: N/A  . Number of children: N/A  . Years of education: N/A   Occupational History  . Not on file.   Social History Main Topics  . Smoking status: Never Smoker  . Smokeless tobacco: Never Used  . Alcohol use 0.0 oz/week     Comment: socially  . Drug use: No  . Sexual activity: Not on file   Other Topics Concern  . Not on file   Social History Narrative  . No narrative on file    Past Surgical History:  Procedure Laterality Date  .  ABDOMINAL HYSTERECTOMY    . BREAST ENHANCEMENT SURGERY    . LAPAROSCOPIC ASSISTED VAGINAL HYSTERECTOMY N/A 12/02/2013   Procedure: LAPAROSCOPIC ASSISTED VAGINAL HYSTERECTOMY;  Surgeon: Meriel Pica, MD;  Location: [facility];  Service: Gynecology;  Laterality: N/A;  . LAPAROSCOPY N/A 11/24/2013   Procedure: LAPAROSCOPY DIAGNOSTIC;  Surgeon: Meriel Pica, MD;  Location: [facility];  Service: Gynecology;  Laterality: N/A;  . NASAL SINUS SURGERY    . SALPINGOOPHORECTOMY Bilateral 12/02/2013   Procedure: SALPINGO OOPHORECTOMY;  Surgeon: Meriel Pica, MD;  Location: [facility];  Service: Gynecology;  Laterality: Bilateral;  . TUBAL LIGATION  2007      (Not in a hospital admission)  Physical Exam: Blood pressure 99/87, pulse 100, temperature 98.6 F (37 C), temperature source Oral, resp. rate 19, height  (1.727 m), weight 180 lb (81.6 kg), last menstrual period 11/19/2013, SpO2 100 %.   Affect appropriate Healthy:  appears stated age HEENT: normal Neck supple with no adenopathy JVP normal no bruits no thyromegaly Lungs clear with no wheezing and good diaphragmatic motion Heart:  S1/S2 no murmur, no rub, gallop or click PMI normal Abdomen: benighn, BS positve, no tenderness, no AAA no bruit.  No HSM or HJR  Distal pulses intact with no bruits No edema Neuro non-focal Skin warm and dry No muscular weakness Has tatoo "Forgivness" right over right radial   No current facility-administered medications on file prior to encounter.    Current Outpatient Prescriptions on File Prior to Encounter  Medication Sig Dispense Refill  . hydrochlorothiazide (MICROZIDE) 12.5 MG capsule Take 1 capsule (12.5 mg total) by mouth daily. 30 capsule 3  . pantoprazole (PROTONIX) 40 MG tablet Take 1 tablet (40 mg total) by mouth daily at 6 (six) AM. 30 tablet 3    Labs:   Lab Results  Component Value Date   WBC 9.9 02/08/2017   HGB 13.5 02/08/2017   HCT 38.3 02/08/2017   MCV  96.7 02/08/2017   PLT 269 02/08/2017     Recent Labs Lab 02/08/17 1421  NA 137  K 3.7  CL 99*  CO2 30  BUN 9  CREATININE 0.85  CALCIUM 9.8  GLUCOSE 126*   Lab Results  Component Value Date   TROPONINI <0.03 02/08/2017   TROPONINI <0.03 01/19/2017   TROPONINI <0.03 01/19/2017     Lab Results  Component Value Date   CHOL 201 (H) 01/19/2017   Lab Results  Component Value Date   HDL 69 01/19/2017   Lab Results  Component Value Date   LDLCALC 119 (H) 01/19/2017   Lab Results  Component Value Date   TRIG 66 01/19/2017   Lab Results  Component Value Date   CHOLHDL 2.9 01/19/2017   No results found for: LDLDIRECT     Radiology: Dg Chest 2 View  Result Date: 02/08/2017 CLINICAL DATA:  Left-sided chest pain EXAM: CHEST  2 VIEW COMPARISON:  01/18/2017 FINDINGS: The heart size and mediastinal contours are within normal limits. Both lungs are clear. The visualized skeletal structures are unremarkable. IMPRESSION: No active cardiopulmonary disease. Electronically Signed   By: Signa Kell M.D.   On: 02/08/2017 14:46   Dg Chest 2 View  Result Date: 01/18/2017 CLINICAL DATA:  Mid to right chest pain with shortness of breath EXAM: CHEST  2 VIEW COMPARISON:  None. FINDINGS: The heart size and mediastinal contours are within normal limits. Mild atherosclerosis. Both lungs are clear. The visualized skeletal structures are unremarkable. IMPRESSION: No active cardiopulmonary disease. Electronically Signed   By: Jasmine Pang M.D.   On: 01/18/2017 20:38   Ct Angio Chest Pe W And/or Wo Contrast  Result Date: 01/19/2017 CLINICAL DATA:  46 year old female with chest pain. EXAM: CT ANGIOGRAPHY CHEST WITH CONTRAST TECHNIQUE: Multidetector CT imaging of the chest was performed using the standard protocol during bolus administration of intravenous contrast. Multiplanar CT image reconstructions and MIPs were obtained to evaluate the vascular anatomy. CONTRAST:  100 cc Isovue 370  COMPARISON:  Chest radiograph dated 01/19/2017 FINDINGS: Cardiovascular: There is no cardiomegaly or pericardial effusion. The thoracic aorta appears unremarkable. The origins of the great vessels of the aortic arch appear patent. There is no CT evidence of pulmonary embolism. Mediastinum/Nodes: There is no hilar or mediastinal adenopathy. The esophagus is grossly unremarkable. No thyroid nodules identified. Lungs/Pleura: The lungs are clear. There is no pleural effusion or pneumothorax. The central airways are patent. Upper Abdomen: The visualized upper abdomen is unremarkable. Musculoskeletal: No chest wall abnormality. No acute or significant osseous findings. Bilateral breast implants noted. Review of the MIP images confirms the above findings. IMPRESSION: No acute intrathoracic pathology. No CT evidence of pulmonary embolism. Electronically Signed   By: Elgie Collard M.D.   On: 01/19/2017 00:19  EKG: SR inferior T wave inversions   ASSESSMENT AND PLAN:   Chest Pain:  With 2 ER visits now and abnormal ETT Father with premature CAD. Options include cardiac CT and cath. She is so anxious with HR 100 in ER feel that diagnostic cath best option Would identify spasm and SCAD better as well. ASA start low dose beta blocker Discussed risks including stroke, dye reaction, MI, bleeding and need for emergency surgery patient ans husband willing to proceed Use left radial as her tatoo is right over the right radial  Anxiety:  PRN xanax consider outpatient f/u and SSRI  HTN:  Hold diuretic prior to cath  Lab called and arrangements for transportation to [facility] made 1:00 case with Dr [place] Signed: Theron Arista Nishan4/03/2017, 4:00 PM

## 2017-02-09 ENCOUNTER — Encounter (HOSPITAL_COMMUNITY): Payer: Self-pay | Admitting: Cardiology

## 2017-02-09 ENCOUNTER — Encounter (HOSPITAL_COMMUNITY)
Admission: EM | Disposition: A | Discharge status: Home / Self Care | Payer: Self-pay | Source: Home / Self Care | Attending: Emergency Medicine

## 2017-02-09 DIAGNOSIS — R079 Chest pain, unspecified: Secondary | ICD-10-CM | POA: Diagnosis not present

## 2017-02-09 HISTORY — PX: LEFT HEART CATH AND CORONARY ANGIOGRAPHY: CATH118249

## 2017-02-09 LAB — COMPREHENSIVE METABOLIC PANEL
ALBUMIN: 4 g/dL (ref 3.5–5.0)
ALT: 22 U/L (ref 14–54)
AST: 25 U/L (ref 15–41)
Alkaline Phosphatase: 76 U/L (ref 38–126)
Anion gap: 11 (ref 5–15)
BUN: 9 mg/dL (ref 6–20)
CHLORIDE: 101 mmol/L (ref 101–111)
CO2: 28 mmol/L (ref 22–32)
Calcium: 9.7 mg/dL (ref 8.9–10.3)
Creatinine, Ser: 0.76 mg/dL (ref 0.44–1.00)
GFR calc Af Amer: >60 mL/min (ref >60)
GFR calc non Af Amer: >60 mL/min (ref >60)
Glucose, Bld: 114 mg/dL — ABNORMAL HIGH (ref 65–99)
POTASSIUM: 3.4 mmol/L — AB (ref 3.5–5.1)
SODIUM: 140 mmol/L (ref 135–145)
Total Bilirubin: 1 mg/dL (ref 0.3–1.2)
Total Protein: 7.5 g/dL (ref 6.5–8.1)

## 2017-02-09 LAB — LIPID PANEL
CHOL/HDL RATIO: 3.2 ratio
CHOLESTEROL: 231 mg/dL — AB (ref 0–200)
HDL: 73 mg/dL (ref >40)
LDL CALC: 131 mg/dL — AB (ref 0–99)
Triglycerides: 136 mg/dL (ref <150)
VLDL: 27 mg/dL (ref 0–40)

## 2017-02-09 LAB — MAGNESIUM: Magnesium: 2.1 mg/dL (ref 1.7–2.4)

## 2017-02-09 LAB — HIV ANTIBODY (ROUTINE TESTING W REFLEX): HIV Screen 4th Generation wRfx: NONREACTIVE

## 2017-02-09 SURGERY — LEFT HEART CATH AND CORONARY ANGIOGRAPHY
Anesthesia: LOCAL

## 2017-02-09 MED ORDER — HEPARIN SODIUM (PORCINE) 1000 UNIT/ML IJ SOLN
INTRAMUSCULAR | Status: AC
  Filled 2017-02-09: qty 1

## 2017-02-09 MED ORDER — LIDOCAINE HCL (PF) 1 % IJ SOLN
INTRAMUSCULAR | Status: DC | PRN
  Administered 2017-02-09: 2 mL

## 2017-02-09 MED ORDER — VERAPAMIL HCL 2.5 MG/ML IV SOLN
INTRAVENOUS | Status: AC
  Filled 2017-02-09: qty 2

## 2017-02-09 MED ORDER — LIDOCAINE HCL (PF) 1 % IJ SOLN
INTRAMUSCULAR | Status: AC
  Filled 2017-02-09: qty 30

## 2017-02-09 MED ORDER — METOPROLOL TARTRATE 25 MG PO TABS: 25.0000 mg | ORAL_TABLET | Freq: Two times a day (BID) | ORAL | 6 refills | Status: AC

## 2017-02-09 MED ORDER — MIDAZOLAM HCL 2 MG/2ML IJ SOLN
INTRAMUSCULAR | Status: DC | PRN
  Administered 2017-02-09: 1 mg via INTRAVENOUS
  Administered 2017-02-09: 2 mg via INTRAVENOUS

## 2017-02-09 MED ORDER — POTASSIUM CHLORIDE CRYS ER 20 MEQ PO TBCR
40.0000 meq | EXTENDED_RELEASE_TABLET | Freq: Once | ORAL | Status: AC
  Administered 2017-02-09: 40 meq via ORAL
  Filled 2017-02-09: qty 2

## 2017-02-09 MED ORDER — IOPAMIDOL (ISOVUE-370) INJECTION 76%
INTRAVENOUS | Status: DC | PRN
  Administered 2017-02-09: 70 mL via INTRA_ARTERIAL

## 2017-02-09 MED ORDER — HEPARIN (PORCINE) IN NACL 2-0.9 UNIT/ML-% IJ SOLN
INTRAMUSCULAR | Status: AC
Start: 2017-02-09 — End: 2017-02-09
  Filled 2017-02-09: qty 1000

## 2017-02-09 MED ORDER — HEPARIN SODIUM (PORCINE) 1000 UNIT/ML IJ SOLN
INTRAMUSCULAR | Status: DC | PRN
  Administered 2017-02-09: 4000 [IU] via INTRAVENOUS

## 2017-02-09 MED ORDER — IOPAMIDOL (ISOVUE-370) INJECTION 76%
INTRAVENOUS | Status: AC
  Filled 2017-02-09: qty 100

## 2017-02-09 MED ORDER — MIDAZOLAM HCL 2 MG/2ML IJ SOLN
INTRAMUSCULAR | Status: AC
  Filled 2017-02-09: qty 2

## 2017-02-09 MED ORDER — VERAPAMIL HCL 2.5 MG/ML IV SOLN
INTRAVENOUS | Status: DC | PRN
  Administered 2017-02-09: 10:00:00 via INTRA_ARTERIAL

## 2017-02-09 MED ORDER — FENTANYL CITRATE (PF) 100 MCG/2ML IJ SOLN
INTRAMUSCULAR | Status: AC
  Filled 2017-02-09: qty 2

## 2017-02-09 MED ORDER — FENTANYL CITRATE (PF) 100 MCG/2ML IJ SOLN
INTRAMUSCULAR | Status: DC | PRN
  Administered 2017-02-09 (×2): 25 ug via INTRAVENOUS

## 2017-02-09 MED ORDER — HEPARIN (PORCINE) IN NACL 2-0.9 UNIT/ML-% IJ SOLN
INTRAMUSCULAR | Status: DC | PRN
  Administered 2017-02-09: 1000 mL

## 2017-02-09 SURGICAL SUPPLY — 10 items

## 2017-02-09 NOTE — Progress Notes (Signed)
Error

## 2017-02-09 NOTE — Interval H&P Note (Signed)
History and Physical Interval Note:  02/09/2017 9:59 AM  Monica Osborne  has presented today for surgery, with the diagnosis of cp, abnormal stress test  The various methods of treatment have been discussed with the patient and family. After consideration of risks, benefits and other options for treatment, the patient has consented to  Procedure(s): Left Heart Cath and Coronary Angiography (N/A) as a surgical intervention .  The patient's history has been reviewed, patient examined, no change in status, stable for surgery.  I have reviewed the patient's chart and labs.  Questions were answered to the patient's satisfaction.    Cath Lab Visit (complete for each Cath Lab visit)  Clinical Evaluation Leading to the Procedure:   ACS: Yes.    Non-ACS:    Anginal Classification: CCS III  Anti-ischemic medical therapy: Minimal Therapy (1 class of medications)  Non-Invasive Test Results: Low-risk stress test findings: cardiac mortality <1%/year  Prior CABG: No previous CABG       Theron Arista [facility] 02/09/2017 9:59 AM

## 2017-02-09 NOTE — Plan of Care (Signed)
Problem: Pain Managment: Goal: General experience of comfort will improve Outcome: Progressing No complain of pain or discomfort.   

## 2017-02-09 NOTE — Progress Notes (Signed)
Discharge to home ambulatory. Right radial site level 0, dressing CDI no s/s of swelling or hematoma noted upon discharge. D/c instructions and follow up appointments  done and discussed with patient, verbalized understanding.

## 2017-02-09 NOTE — Progress Notes (Signed)
Back from Cathlab Right radial site level , TRband 12cc per report.Patient alert and oriented.will monitor accordingly.

## 2017-02-09 NOTE — Discharge Summary (Signed)
The patient has been seen in conjunction with Vin Bhagat, PAC. All aspects of care have been considered and discussed. The patient has been personally interviewed, examined, and all clinical data has been reviewed.   Coronary arteries are normal.  Further evaluation as an OP if needed.  Needs potassium rechecked as an outpatient.  Discharge Summary    Patient ID: Monica Osborne,  MRN: [id number], DOB/AGE: 45-Oct-1972 45 y.o.  Admit date: 02/08/2017 Discharge date: 02/09/2017  Primary Care Provider: [facility] Primary Cardiologist: Dr. Wyline Mood   Discharge Diagnoses    Active Problems:   Chest pain   PVCs   Hypokalemia   HTN  Allergies Allergies  Allergen Reactions  . Morphine And Related Other (See Comments)    Hallucinations  . Sulfa Antibiotics Nausea And Vomiting    Diagnostic Studies/Procedures    Left Heart Cath and Coronary Angiography  02/09/17  Conclusion     The left ventricular systolic function is normal.  LV end diastolic pressure is normal.  The left ventricular ejection fraction is 55-65% by visual estimate.   1. Normal coronary anatomy 2. Normal LV function 3. Normal LVEDP  Plan: consider noncardiac causes of chest pain.   Diagnostic Diagram         History of Present Illness     46 y.o. female with no significant PMH except recent recurrent chest pain with ETT showed mm down-sloaping ST depression in the lateral leads during peak exertion that resolve but return in recovery suspicious for ischemia. While at work 4/5 patient had SSCP radiating to both arms with dyspnea and diaphoresis. Pain worse than before did have some relief with nitro given by EMS on way to ER. Seen at [facility] by Dr. Eden Emms 02/08/17 and transferred to [facility] for Cath. EKG showed SR inferior T wave inversions.   Her CAD risk factors: HTN, father MIs 51 and had CABG. Recent echo 01/19/17 showed normal LV function, moderate LVH, grade 2  DD.  Hospital Course     Consultants: none  1. Recurrent chest pain with positive ETT - Symptoms concerning for angina. Troponin negative.  Chest pain free currently since here. Recent echo with normal EF. Cath showed normal coronaries and LVEDP. Cath site looks good. She works in Theatre stage manager. Given r radial cath site-->work note given for 1 week.    Cholesterol 231; HDL 73; LDL Cholesterol 131; Triglycerides 136; VLDL 27 -->follow up with PCP of non cardiac chest pain and lipids management.   2. Ventricular ectopy - Telemetry showed frequent PVCs and bigeminy. Supplement given for low K. Continue BB.  Normal Mg.   3. Hypokalemia - Supplement given   4. HTN - HCTZ hold for cath--> resumed at dischaged.  Continue BB. BP stable.  5. Chronic diastolic CHF - Euvolemic. LVEDP normal by cath.   The patient has been seen by Dr. Katrinka Blazing today and deemed ready for discharge home. All follow-up appointments have been scheduled. Discharge medications are listed below.  _____________   Discharge Vitals Blood pressure 116/78, pulse (!) 58, temperature 98.4 F (36.9 C), temperature source Oral, resp. rate (!) 8, height  (1.727 m), weight 172 lb 1.6 oz (78.1 kg), last menstrual period 11/19/2013, SpO2 99 %.  Filed Weights   02/08/17 1415 02/08/17 1822 02/09/17 0341  Weight: 180 lb (81.6 kg) 173 lb 8 oz (78.7 kg) 172 lb 1.6 oz (78.1 kg)    Labs & Radiologic Studies  CBC  Recent Labs  02/08/17 1421  WBC 9.9  HGB 13.5  HCT 38.3  MCV 96.7  PLT 269   Basic Metabolic Panel  Recent Labs  02/08/17 1421 02/09/17 0335 02/09/17 1103  NA 137 140  --   K 3.7 3.4*  --   CL 99* 101  --   CO2 30 28  --   GLUCOSE 126* 114*  --   BUN 9 9  --   CREATININE 0.85 0.76  --   CALCIUM 9.8 9.7  --   MG  --   --  2.1   Liver Function Tests  Recent Labs  02/09/17 0335  AST 25  ALT 22  ALKPHOS 76  BILITOT 1.0  PROT 7.5  ALBUMIN 4.0   No results for input(s): LIPASE,  AMYLASE in the last 72 hours. Cardiac Enzymes  Recent Labs  02/08/17 1424  TROPONINI <0.03   BNP Invalid input(s): POCBNP D-Dimer No results for input(s): DDIMER in the last 72 hours. Hemoglobin A1C No results for input(s): HGBA1C in the last 72 hours. Fasting Lipid Panel  Recent Labs  02/09/17 0335  CHOL 231*  HDL 73  LDLCALC 131*  TRIG 136  CHOLHDL 3.2   Thyroid Function Tests No results for input(s): TSH, T4TOTAL, T3FREE, THYROIDAB in the last 72 hours.  Invalid input(s): FREET3  Dg Chest 2 View  Result Date: 02/08/2017 CLINICAL DATA:  Left-sided chest pain EXAM: CHEST  2 VIEW COMPARISON:  01/18/2017 FINDINGS: The heart size and mediastinal contours are within normal limits. Both lungs are clear. The visualized skeletal structures are unremarkable. IMPRESSION: No active cardiopulmonary disease. Electronically Signed   By: Signa Kell M.D.   On: 02/08/2017 14:46   Dg Chest 2 View  Result Date: 01/18/2017 CLINICAL DATA:  Mid to right chest pain with shortness of breath EXAM: CHEST  2 VIEW COMPARISON:  None. FINDINGS: The heart size and mediastinal contours are within normal limits. Mild atherosclerosis. Both lungs are clear. The visualized skeletal structures are unremarkable. IMPRESSION: No active cardiopulmonary disease. Electronically Signed   By: Jasmine Pang M.D.   On: 01/18/2017 20:38   Ct Angio Chest Pe W And/or Wo Contrast  Result Date: 01/19/2017 CLINICAL DATA:  46 year old female with chest pain. EXAM: CT ANGIOGRAPHY CHEST WITH CONTRAST TECHNIQUE: Multidetector CT imaging of the chest was performed using the standard protocol during bolus administration of intravenous contrast. Multiplanar CT image reconstructions and MIPs were obtained to evaluate the vascular anatomy. CONTRAST:  100 cc Isovue 370 COMPARISON:  Chest radiograph dated 01/19/2017 FINDINGS: Cardiovascular: There is no cardiomegaly or pericardial effusion. The thoracic aorta appears unremarkable. The  origins of the great vessels of the aortic arch appear patent. There is no CT evidence of pulmonary embolism. Mediastinum/Nodes: There is no hilar or mediastinal adenopathy. The esophagus is grossly unremarkable. No thyroid nodules identified. Lungs/Pleura: The lungs are clear. There is no pleural effusion or pneumothorax. The central airways are patent. Upper Abdomen: The visualized upper abdomen is unremarkable. Musculoskeletal: No chest wall abnormality. No acute or significant osseous findings. Bilateral breast implants noted. Review of the MIP images confirms the above findings. IMPRESSION: No acute intrathoracic pathology. No CT evidence of pulmonary embolism. Electronically Signed   By: Elgie Collard M.D.   On: 01/19/2017 00:19    Disposition   Pt is being discharged home today in good condition.  Follow-up Plans & Appointments    Follow-up Information    Joni Reining, NP. Go on 02/16/2017.  Specialties:  Nurse Practitioner, Radiology, Cardiology Why:  :30 for post hospital cardiology follow up  Contact information: [address] [place] [address] [phone]        [facility]. Schedule an appointment as soon as possible for a visit in 4 day(s).   Why:  for non cardiac chest pain evaluation  Contact information: [address] [place] [address] [phone]          Discharge Instructions    Diet - low sodium heart healthy    Complete by:  As directed    Discharge instructions    Complete by:  As directed    No driving for 48 hoursNo lifting over 5 lbs for 1 week. No sexual activity for 1 week.  Keep procedure site clean & dry. If you notice increased pain, swelling, bleeding or pus, call/return!  You may shower, but no soaking baths/hot tubs/pools for 1 week.   Increase activity slowly    Complete by:  As directed       Discharge Medications   Current Discharge Medication List    START taking these medications   Details   metoprolol tartrate (LOPRESSOR) 25 MG tablet Take 1 tablet (25 mg total) by mouth 2 (two) times daily. Qty: 30 tablet, Refills: 6      CONTINUE these medications which have NOT CHANGED   Details  aspirin EC 81 MG tablet Take 81 mg by mouth daily.    fluticasone (FLONASE) 50 MCG/ACT nasal spray Place 1 spray into both nostrils daily.    hydrochlorothiazide (MICROZIDE) 12.5 MG capsule Take 1 capsule (12.5 mg total) by mouth daily. Qty: 30 capsule, Refills: 3    pantoprazole (PROTONIX) 40 MG tablet Take 1 tablet (40 mg total) by mouth daily at 6 (six) AM. Qty: 30 tablet, Refills: 3           Outstanding Labs/Studies   None  Duration of Discharge Encounter   Greater than 30 minutes including physician time.  Signed, Bhagat,Bhavinkumar PA-C 02/09/2017, 12:54 PM

## 2017-02-09 NOTE — Progress Notes (Signed)
The patient has been seen in conjunction with Vin Bhagat, PA-C. All aspects of care have been considered and discussed. The patient has been personally interviewed, examined, and all clinical data has been reviewed.   Recurring chest discomfort, abnormal exercise stress test, and family history of CAD.  Needs management of lipids.  Coronary angiography today to define anatomy. May have a total occlusion with collaterals.  Procedure and risks discussed.   Progress Note  Patient Name: Monica Osborne Date of Encounter: 02/09/2017  Primary Cardiologist: Dr. Wyline Mood  Subjective   Feeling well. No chest pain, sob or palpitations. For cath today.   Inpatient Medications    Scheduled Meds: . [START ON 02/10/2017] aspirin EC  81 mg Oral Daily  . enoxaparin (LOVENOX) injection  40 mg Subcutaneous Q24H  . fluticasone  1 spray Each Nare Daily  . metoprolol tartrate  25 mg Oral BID  . pantoprazole  40 mg Oral Q0600  . sodium chloride flush  3 mL Intravenous Q12H   Continuous Infusions: . sodium chloride 1 mL/kg/hr (02/09/17 0733)   PRN Meds: sodium chloride, acetaminophen, ALPRAZolam, nitroGLYCERIN, ondansetron (ZOFRAN) IV, sodium chloride flush, zolpidem   Vital Signs    Vitals:   02/08/17 1703 02/08/17 1822 02/08/17 2100 02/09/17 0341  BP:  (!) 141/91 117/73 128/77  Pulse:  99 86 63  Resp:  14    Temp: 98.1 F (36.7 C) 98.6 F (37 C) 98.4 F (36.9 C) 98.4 F (36.9 C)  TempSrc:  Oral Oral Oral  SpO2:   98% 100%  Weight:  173 lb 8 oz (78.7 kg)  172 lb 1.6 oz (78.1 kg)  Height:   (1.727 m)      Intake/Output Summary (Last 24 hours) at 02/09/17 0933 Last data filed at 02/09/17 0615  Gross per 24 hour  Intake              243 ml  Output              900 ml  Net             -657 ml   Filed Weights   02/08/17 1415 02/08/17 1822 02/09/17 0341  Weight: 180 lb (81.6 kg) 173 lb 8 oz (78.7 kg) 172 lb 1.6 oz (78.1 kg)    Telemetry    Sinus rhythm with frequent  PVCs and Ventricular bigemeny- Personally Reviewed  ECG     sinus rhythm with non specific ST changes- Personally Reviewed  Physical Exam   GEN: No acute distress.   Neck: No JVD Cardiac:irregular , no murmurs, rubs, or gallops.  Respiratory: Clear to auscultation bilaterally. GI: Soft, nontender, non-distended  MS: No edema; No deformity. Neuro:  Nonfocal  Psych: Normal affect   Labs    Chemistry Recent Labs Lab 02/08/17 1421 02/09/17 0335  NA 137 140  K 3.7 3.4*  CL 99* 101  CO2 30 28  GLUCOSE 126* 114*  BUN 9 9  CREATININE 0.85 0.76  CALCIUM 9.8 9.7  PROT  --  7.5  ALBUMIN  --  4.0  AST  --  25  ALT  --  22  ALKPHOS  --  76  BILITOT  --  1.0  GFRNONAA >60 >60  GFRAA >60 >60  ANIONGAP 8 11     Hematology Recent Labs Lab 02/08/17 1421  WBC 9.9  RBC 3.96  HGB 13.5  HCT 38.3  MCV 96.7  MCH 34.1*  MCHC 35.2  RDW 12.9  PLT 269    Cardiac Enzymes Recent Labs Lab 02/08/17 1424  TROPONINI <0.03   No results for input(s): TROPIPOC in the last 168 hours.   BNPNo results for input(s): BNP, PROBNP in the last 168 hours.   DDimer No results for input(s): DDIMER in the last 168 hours.   Radiology    Dg Chest 2 View  Result Date: 02/08/2017 CLINICAL DATA:  Left-sided chest pain EXAM: CHEST  2 VIEW COMPARISON:  01/18/2017 FINDINGS: The heart size and mediastinal contours are within normal limits. Both lungs are clear. The visualized skeletal structures are unremarkable. IMPRESSION: No active cardiopulmonary disease. Electronically Signed   By: Signa Kell M.D.   On: 02/08/2017 14:46    Cardiac Studies   Pending cath   Patient Profile     46 y.o. female with no significant PMH except recent recurrent chest pain with ETT showed mm down-sloaping ST depression in the lateral leads during peak exertion that resolve but return in recovery suspicious for ischemia. While at work 4/5 patient had SSCP radiating to both arms with dyspnea and diaphoresis.  Seen at [facility] and transferred to [facility] for Cath.   Her CAD risk factors: HTN, father MIs 41 and had CABG  Assessment & Plan    1. Recurrent chest pain with positive ETT - Symptoms concerning for angina. Troponin negative. For cath today. Chest pain free currently. Continue ASA and BB.  - Echo 01/19/17 showed normal LVEF of 60-65%, moderate LVH and grade 2 DD.   02/09/2017: Cholesterol 231; HDL 73; LDL Cholesterol 131; Triglycerides 136; VLDL 27 --> if CAD, start statin.   2. Ventricular ectopy - Telemetry showed frequent PVCs and bigeminy. Supplement given for low K. Continue BB. Pending cath.  - Will check Mg.   3. Hypokalemia - Supplement given   4. HTN - HCTZ hold for cath. Continue BB. BP stable  5. Chronic diastolic CHF - Euvolemic. As above.    Signed, Manson Passey, PA  02/09/2017, 9:33 AM

## 2017-02-16 ENCOUNTER — Encounter: Payer: Self-pay | Admitting: *Deleted

## 2017-02-16 ENCOUNTER — Encounter: Payer: Self-pay | Admitting: Adult Health

## 2017-02-16 ENCOUNTER — Ambulatory Visit (INDEPENDENT_AMBULATORY_CARE_PROVIDER_SITE_OTHER): Payer: Commercial Managed Care - PPO | Admitting: Adult Health

## 2017-02-16 VITALS — BP 114/84 | HR 86 | Ht 68.0 in | Wt 177.0 lb

## 2017-02-16 DIAGNOSIS — I10 Essential (primary) hypertension: Secondary | ICD-10-CM

## 2017-02-16 DIAGNOSIS — R0789 Other chest pain: Secondary | ICD-10-CM | POA: Diagnosis not present

## 2017-02-16 NOTE — Progress Notes (Signed)
Name: Monica Osborne    DOB: May 25, 1971  Age: 46 y.o.  MR#: [id number]       PCP:  [facility]      Insurance: Payor: Advertising copywriter / Plan: UMR/UHC PPO / Product Type: *No Product type* /   CC:   No chief complaint on file.   VS Vitals:   02/16/17 1417  BP: 114/84  Pulse: 86  SpO2: 96%  Weight: 177 lb (80.3 kg)  Height:  (1.727 m)    Weights Current Weight  02/16/17 177 lb (80.3 kg)  02/09/17 172 lb 1.6 oz (78.1 kg)  02/05/17 180 lb 9.6 oz (81.9 kg)    Blood Pressure  BP Readings from Last 3 Encounters:  02/16/17 114/84  02/09/17 115/80  02/05/17 126/81     Admit date:  (Not on file) Last encounter with [facility]MR:  Visit date not found   Allergy Morphine and related and Sulfa antibiotics  Current Outpatient Prescriptions  Medication Sig Dispense Refill  . aspirin EC 81 MG tablet Take 81 mg by mouth daily.    . fluticasone (FLONASE) 50 MCG/ACT nasal spray Place 1 spray into both nostrils daily.    . hydrochlorothiazide (MICROZIDE) 12.5 MG capsule Take 1 capsule (12.5 mg total) by mouth daily. 30 capsule 3  . metoprolol tartrate (LOPRESSOR) 25 MG tablet Take 1 tablet (25 mg total) by mouth 2 (two) times daily. 30 tablet 6  . pantoprazole (PROTONIX) 40 MG tablet Take 1 tablet (40 mg total) by mouth daily at 6 (six) AM. 30 tablet 3   No current facility-administered medications for this visit.     Discontinued Meds:   There are no discontinued medications.  Patient Active Problem List   Diagnosis Date Noted  . Chest pain 01/19/2017  . Abnormal EKG 01/19/2017  . Sinus infection 01/19/2017  . Dyslipidemia 01/19/2017  . Shortness of breath   . Gastroesophageal reflux disease   . Endometriosis 12/03/2013  . Pelvic pain 12/02/2013    LABS    Component Value Date/Time   NA 140 02/09/2017 0335   NA 137 02/08/2017 1421   NA 139 01/19/2017 0340   K 3.4 (L) 02/09/2017 0335   K 3.7 02/08/2017 1421   K 3.7 01/19/2017 0340   CL 101  02/09/2017 0335   CL 99 (L) 02/08/2017 1421   CL 104 01/19/2017 0340   CO2 28 02/09/2017 0335   CO2 30 02/08/2017 1421   CO2 26 01/19/2017 0340   GLUCOSE 114 (H) 02/09/2017 0335   GLUCOSE 126 (H) 02/08/2017 1421   GLUCOSE 111 (H) 01/19/2017 0340   BUN 9 02/09/2017 0335   BUN 9 02/08/2017 1421   BUN 7 01/19/2017 0340   CREATININE 0.76 02/09/2017 0335   CREATININE 0.85 02/08/2017 1421   CREATININE 0.67 01/19/2017 0340   CALCIUM 9.7 02/09/2017 0335   CALCIUM 9.8 02/08/2017 1421   CALCIUM 9.2 01/19/2017 0340   GFRNONAA >60 02/09/2017 0335   GFRNONAA >60 02/08/2017 1421   GFRNONAA >60 01/19/2017 0340   GFRAA >60 02/09/2017 0335   GFRAA >60 02/08/2017 1421   GFRAA >60 01/19/2017 0340   CMP     Component Value Date/Time   NA 140 02/09/2017 0335   K 3.4 (L) 02/09/2017 0335   CL 101 02/09/2017 0335   CO2 28 02/09/2017 0335   GLUCOSE 114 (H) 02/09/2017 0335   BUN 9 02/09/2017 0335   CREATININE 0.76 02/09/2017 0335   CALCIUM 9.7 02/09/2017 0335  PROT 7.5 02/09/2017 0335   ALBUMIN 4.0 02/09/2017 0335   AST 25 02/09/2017 0335   ALT 22 02/09/2017 0335   ALKPHOS 76 02/09/2017 0335   BILITOT 1.0 02/09/2017 0335   GFRNONAA >60 02/09/2017 0335   GFRAA >60 02/09/2017 0335       Component Value Date/Time   WBC 9.9 02/08/2017 1421   WBC 8.1 01/19/2017 0340   WBC 10.3 01/18/2017 2006   HGB 13.5 02/08/2017 1421   HGB 13.1 01/19/2017 0340   HGB 14.1 01/18/2017 2006   HCT 38.3 02/08/2017 1421   HCT 37.7 01/19/2017 0340   HCT 41.0 01/18/2017 2006   MCV 96.7 02/08/2017 1421   MCV 96.2 01/19/2017 0340   MCV 96.7 01/18/2017 2006    Lipid Panel     Component Value Date/Time   CHOL 231 (H) 02/09/2017 0335   TRIG 136 02/09/2017 0335   HDL 73 02/09/2017 0335   CHOLHDL 3.2 02/09/2017 0335   VLDL 27 02/09/2017 0335   LDLCALC 131 (H) 02/09/2017 0335    ABG No results found for: PHART, PCO2ART, PO2ART, HCO3, TCO2, ACIDBASEDEF, O2SAT   Lab Results  Component Value Date   TSH  5.155 (H) 01/19/2017   BNP (last 3 results) No results for input(s): BNP in the last 8760 hours.  ProBNP (last 3 results) No results for input(s): PROBNP in the last 8760 hours.  Cardiac Panel (last 3 results) No results for input(s): CKTOTAL, CKMB, TROPONINI, RELINDX in the last 72 hours.  Iron/TIBC/Ferritin/ %Sat No results found for: IRON, TIBC, FERRITIN, IRONPCTSAT   EKG Orders placed or performed during the hospital encounter of 02/08/17  . ED EKG within 10 minutes  . ED EKG within 10 minutes     Prior Assessment and Plan Problem List as of 02/16/2017 Reviewed: 02/05/2017 12:28 PM by Dina Rich, MD     Respiratory   Sinus infection     Digestive   Gastroesophageal reflux disease     Other   Pelvic pain   Endometriosis   Chest pain   Abnormal EKG   Dyslipidemia   Shortness of breath       Imaging: Dg Chest 2 View  Result Date: 02/08/2017 CLINICAL DATA:  Left-sided chest pain EXAM: CHEST  2 VIEW COMPARISON:  01/18/2017 FINDINGS: The heart size and mediastinal contours are within normal limits. Both lungs are clear. The visualized skeletal structures are unremarkable. IMPRESSION: No active cardiopulmonary disease. Electronically Signed   By: Signa Kell M.D.   On: 02/08/2017 14:46   Dg Chest 2 View  Result Date: 01/18/2017 CLINICAL DATA:  Mid to right chest pain with shortness of breath EXAM: CHEST  2 VIEW COMPARISON:  None. FINDINGS: The heart size and mediastinal contours are within normal limits. Mild atherosclerosis. Both lungs are clear. The visualized skeletal structures are unremarkable. IMPRESSION: No active cardiopulmonary disease. Electronically Signed   By: Jasmine Pang M.D.   On: 01/18/2017 20:38   Ct Angio Chest Pe W And/or Wo Contrast  Result Date: 01/19/2017 CLINICAL DATA:  46 year old female with chest pain. EXAM: CT ANGIOGRAPHY CHEST WITH CONTRAST TECHNIQUE: Multidetector CT imaging of the chest was performed using the standard protocol during  bolus administration of intravenous contrast. Multiplanar CT image reconstructions and MIPs were obtained to evaluate the vascular anatomy. CONTRAST:  100 cc Isovue 370 COMPARISON:  Chest radiograph dated 01/19/2017 FINDINGS: Cardiovascular: There is no cardiomegaly or pericardial effusion. The thoracic aorta appears unremarkable. The origins of the great vessels of the aortic  arch appear patent. There is no CT evidence of pulmonary embolism. Mediastinum/Nodes: There is no hilar or mediastinal adenopathy. The esophagus is grossly unremarkable. No thyroid nodules identified. Lungs/Pleura: The lungs are clear. There is no pleural effusion or pneumothorax. The central airways are patent. Upper Abdomen: The visualized upper abdomen is unremarkable. Musculoskeletal: No chest wall abnormality. No acute or significant osseous findings. Bilateral breast implants noted. Review of the MIP images confirms the above findings. IMPRESSION: No acute intrathoracic pathology. No CT evidence of pulmonary embolism. Electronically Signed   By: Elgie Collard M.D.   On: 01/19/2017 00:19

## 2017-02-16 NOTE — Progress Notes (Signed)
Cardiology Office Note   Date:  02/16/2017   ID:  CHRISTON GALLAWAY, DOB 12-25-1970, MRN [id number]  PCP:  [facility]  Cardiologist: Arlington Calix, NP   No chief complaint on file.     History of Present Illness: Monica Osborne is a 46 y.o. female who presents for posthospitalization follow-up after having cardiac catheterization and abnormal ETT revealing downsloping ST depression in the lateral leads during peak exertion, suspicious for ischemia. Multiple cardiovascular risk factors to include hypertension, family history. Cardiac catheterization was completed on 02/09/2017:  Conclusion     The left ventricular systolic function is normal.  LV end diastolic pressure is normal.  The left ventricular ejection fraction is 55-65% by visual estimate.  1. Normal coronary anatomy 2. Normal LV function 3. Normal LVEDP  Plan: consider noncardiac causes of chest pain.   She was continued on low-dose beta blocker, she was found to be hypokalemic with supplements given, continued on HCTZ, and found to be euvolemic in the setting of diastolic CHF. She is without cardiac complaints.   Past Medical History:  Diagnosis Date  . Anxiety and depression   . Pelvic pain in female     Past Surgical History:  Procedure Laterality Date  . ABDOMINAL HYSTERECTOMY    . BREAST ENHANCEMENT SURGERY    . LAPAROSCOPIC ASSISTED VAGINAL HYSTERECTOMY N/A 12/02/2013   Procedure: LAPAROSCOPIC ASSISTED VAGINAL HYSTERECTOMY;  Surgeon: Meriel Pica, MD;  Location: [facility];  Service: Gynecology;  Laterality: N/A;  . LAPAROSCOPY N/A 11/24/2013   Procedure: LAPAROSCOPY DIAGNOSTIC;  Surgeon: Meriel Pica, MD;  Location: [facility];  Service: Gynecology;  Laterality: N/A;  . LEFT HEART CATH AND CORONARY ANGIOGRAPHY N/A 02/09/2017   Procedure: Left Heart Cath and Coronary Angiography;  Surgeon: Peter M Swaziland, MD;  Location: [facility] INVASIVE CV LAB;   Service: Cardiovascular;  Laterality: N/A;  . NASAL SINUS SURGERY    . SALPINGOOPHORECTOMY Bilateral 12/02/2013   Procedure: SALPINGO OOPHORECTOMY;  Surgeon: Meriel Pica, MD;  Location: [facility];  Service: Gynecology;  Laterality: Bilateral;  . TUBAL LIGATION  2007     Current Outpatient Prescriptions  Medication Sig Dispense Refill  . aspirin EC 81 MG tablet Take 81 mg by mouth daily.    . fluticasone (FLONASE) 50 MCG/ACT nasal spray Place 1 spray into both nostrils daily.    . hydrochlorothiazide (MICROZIDE) 12.5 MG capsule Take 1 capsule (12.5 mg total) by mouth daily. 30 capsule 3  . metoprolol tartrate (LOPRESSOR) 25 MG tablet Take 1 tablet (25 mg total) by mouth 2 (two) times daily. 30 tablet 6  . pantoprazole (PROTONIX) 40 MG tablet Take 1 tablet (40 mg total) by mouth daily at 6 (six) AM. 30 tablet 3   No current facility-administered medications for this visit.     Allergies:   Morphine and related and Sulfa antibiotics    Social History:  The patient  reports that she has never smoked. She has never used smokeless tobacco. She reports that she drinks alcohol. She reports that she does not use drugs.   Family History:  The patient's family history includes Bladder Cancer in her maternal grandmother; Heart attack in her father, maternal grandfather, and paternal uncle; Heart disease in her father and paternal grandmother; Heart failure in her father; Hypertension in her father.    ROS: All other systems are reviewed and negative. Unless otherwise mentioned in H&P    PHYSICAL EXAM: VS:  LMP  11/19/2013 Comment: tubal 2000 , BMI There is no height or weight on file to calculate BMI. GEN: Well nourished, well developed, in no acute distress  HEENT: normal  Neck: no JVD, carotid bruits, or masses Cardiac: RRR; no murmurs, rubs, or gallops,no edema  Respiratory:  clear to auscultation bilaterally, normal work of breathing GI: soft, nontender, nondistended, + BS MS: no  deformity or atrophy  Skin: warm and dry, no rash Neuro:  Strength and sensation are intact Psych: euthymic mood, full affect   Recent Labs: 01/19/2017: TSH 5.155 02/08/2017: Hemoglobin 13.5; Platelets 269 02/09/2017: ALT 22; BUN 9; Creatinine, Ser 0.76; Magnesium 2.1; Potassium 3.4; Sodium 140    Lipid Panel    Component Value Date/Time   CHOL 231 (H) 02/09/2017 0335   TRIG 136 02/09/2017 0335   HDL 73 02/09/2017 0335   CHOLHDL 3.2 02/09/2017 0335   VLDL 27 02/09/2017 0335   LDLCALC 131 (H) 02/09/2017 0335      Wt Readings from Last 3 Encounters:  02/09/17 172 lb 1.6 oz (78.1 kg)  02/05/17 180 lb 9.6 oz (81.9 kg)  01/19/17 174 lb (78.9 kg)      ASSESSMENT AND PLAN:  1.  Noncardiac chest pain: Cardiac cath is reassuring. No further cardiac testing. She is to see PCP for further recommendations and exploration of other reasons for chest pain.   2. Hypertension: Well controlled. No changes in medication regimen.    Current medicines are reviewed at length with the patient today.    Labs/ tests ordered today include:  No orders of the defined types were placed in this encounter.    Disposition:   FU with one year Signed, Joni Reining, NP  02/16/2017 8:05 AM    [facility] [address] [place], [place] [address] Phone: [phone]; Fax: [phone]

## 2017-02-16 NOTE — Patient Instructions (Addendum)
Your physician wants you to follow-up in: 1 Year with Dr. Wyline Mood. You will receive a reminder letter in the mail two months in advance. If you don't receive a letter, please call our office to schedule the follow-up appointment.  Your physician recommends that you continue on your current medications as directed. Please refer to the Current Medication list given to you today.  If you need a refill on your cardiac medications before your next appointment, please call your pharmacy.  You have been given a letter to return to work.   Thank you for choosing [facility]!

## 2017-02-23 ENCOUNTER — Other Ambulatory Visit (HOSPITAL_COMMUNITY): Payer: Commercial Managed Care - PPO

## 2017-03-19 ENCOUNTER — Telehealth: Payer: Self-pay | Admitting: Adult Health

## 2017-03-19 NOTE — Telephone Encounter (Signed)
Please give Harrold Donathathan from [employer] a call @ [phone] [facility], that should direct you to Harrold Donathathan who is handling this claim. He is needing to know if the pt was admitted for 24 hrs or more in the hospital.

## 2017-03-20 NOTE — Telephone Encounter (Signed)
Spoke with Monica Osborne and gave her the number to [employer] for more information

## 2017-07-27 IMAGING — DX DG CHEST 2V
2 series · 2 of 2 positions shown · non-contrast
Comparison: 01/18/2017

CLINICAL DATA: Left-sided chest pain

EXAM:
CHEST  2 VIEW

[chest pa]
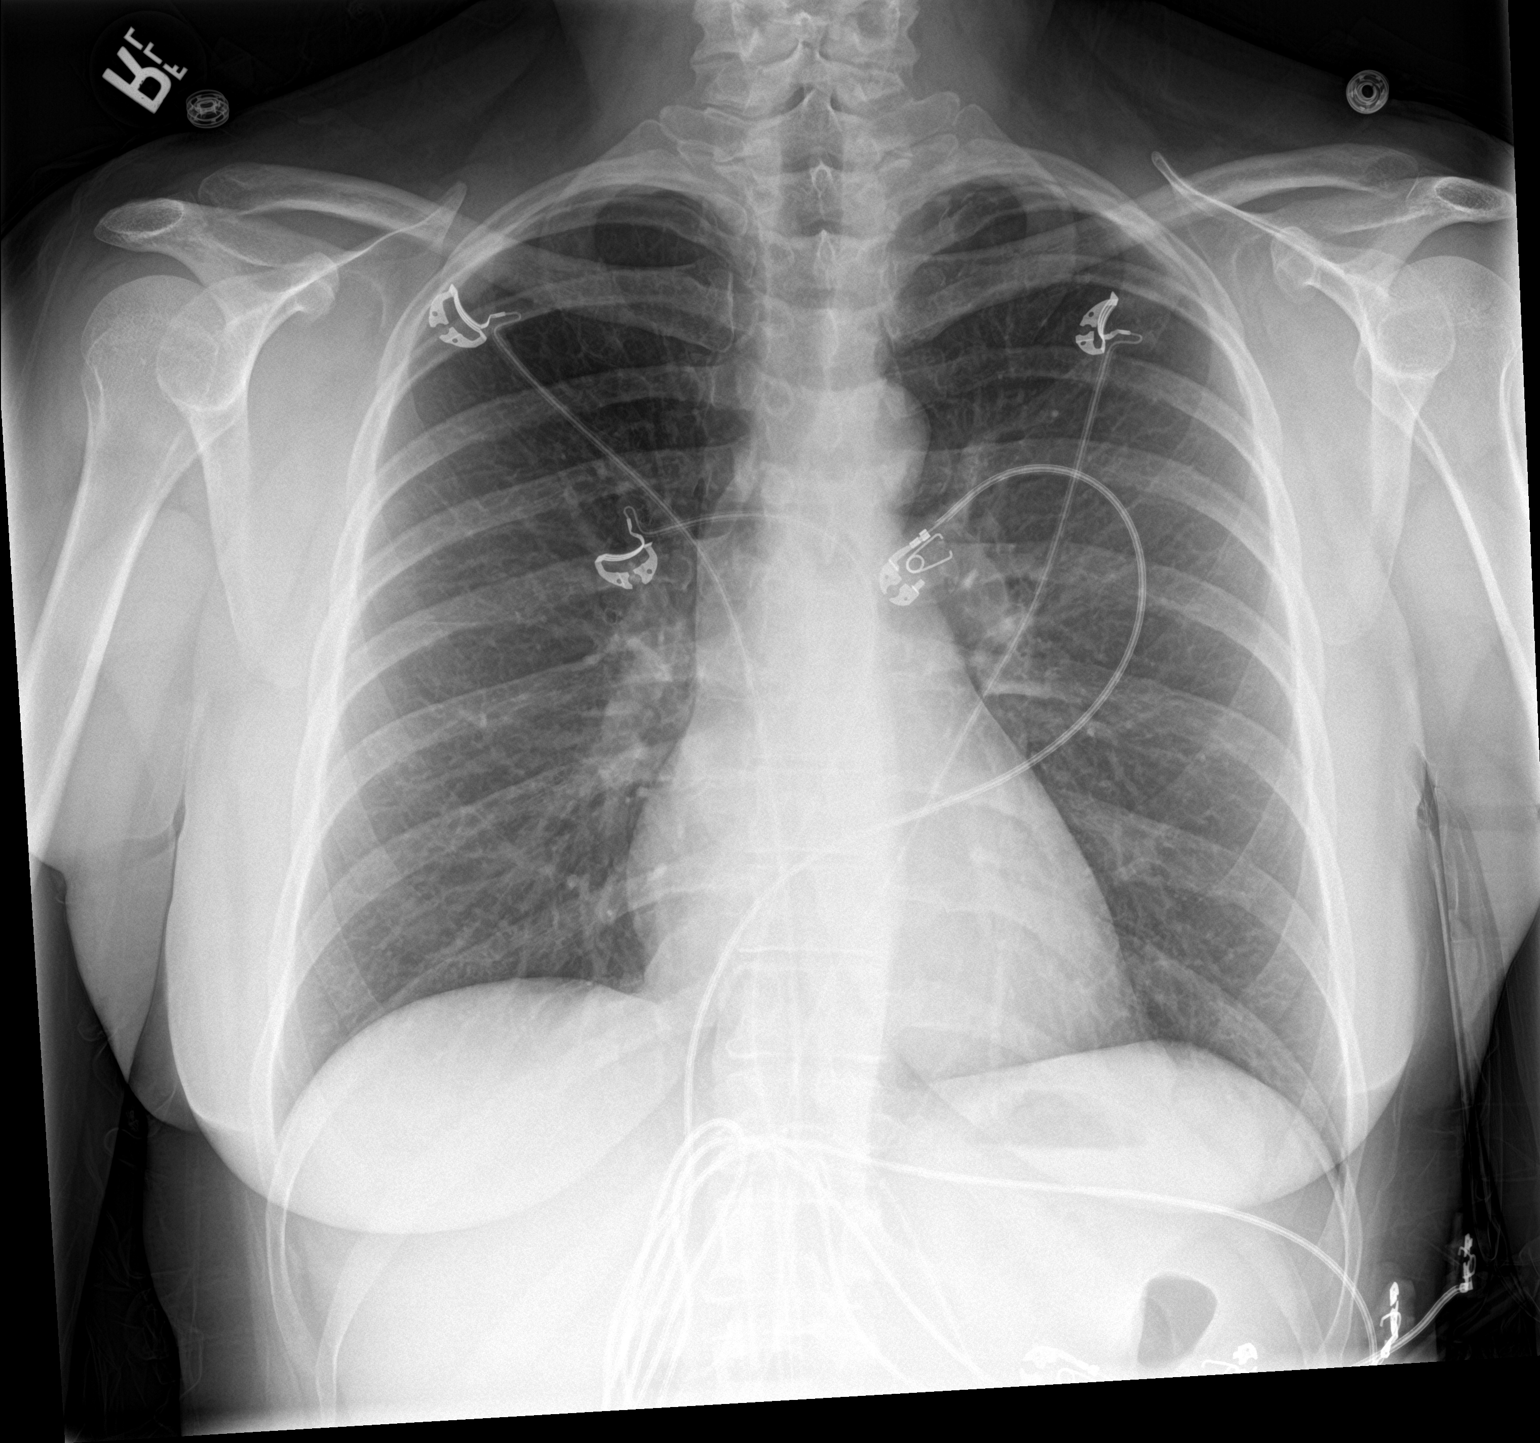

[chest lat]
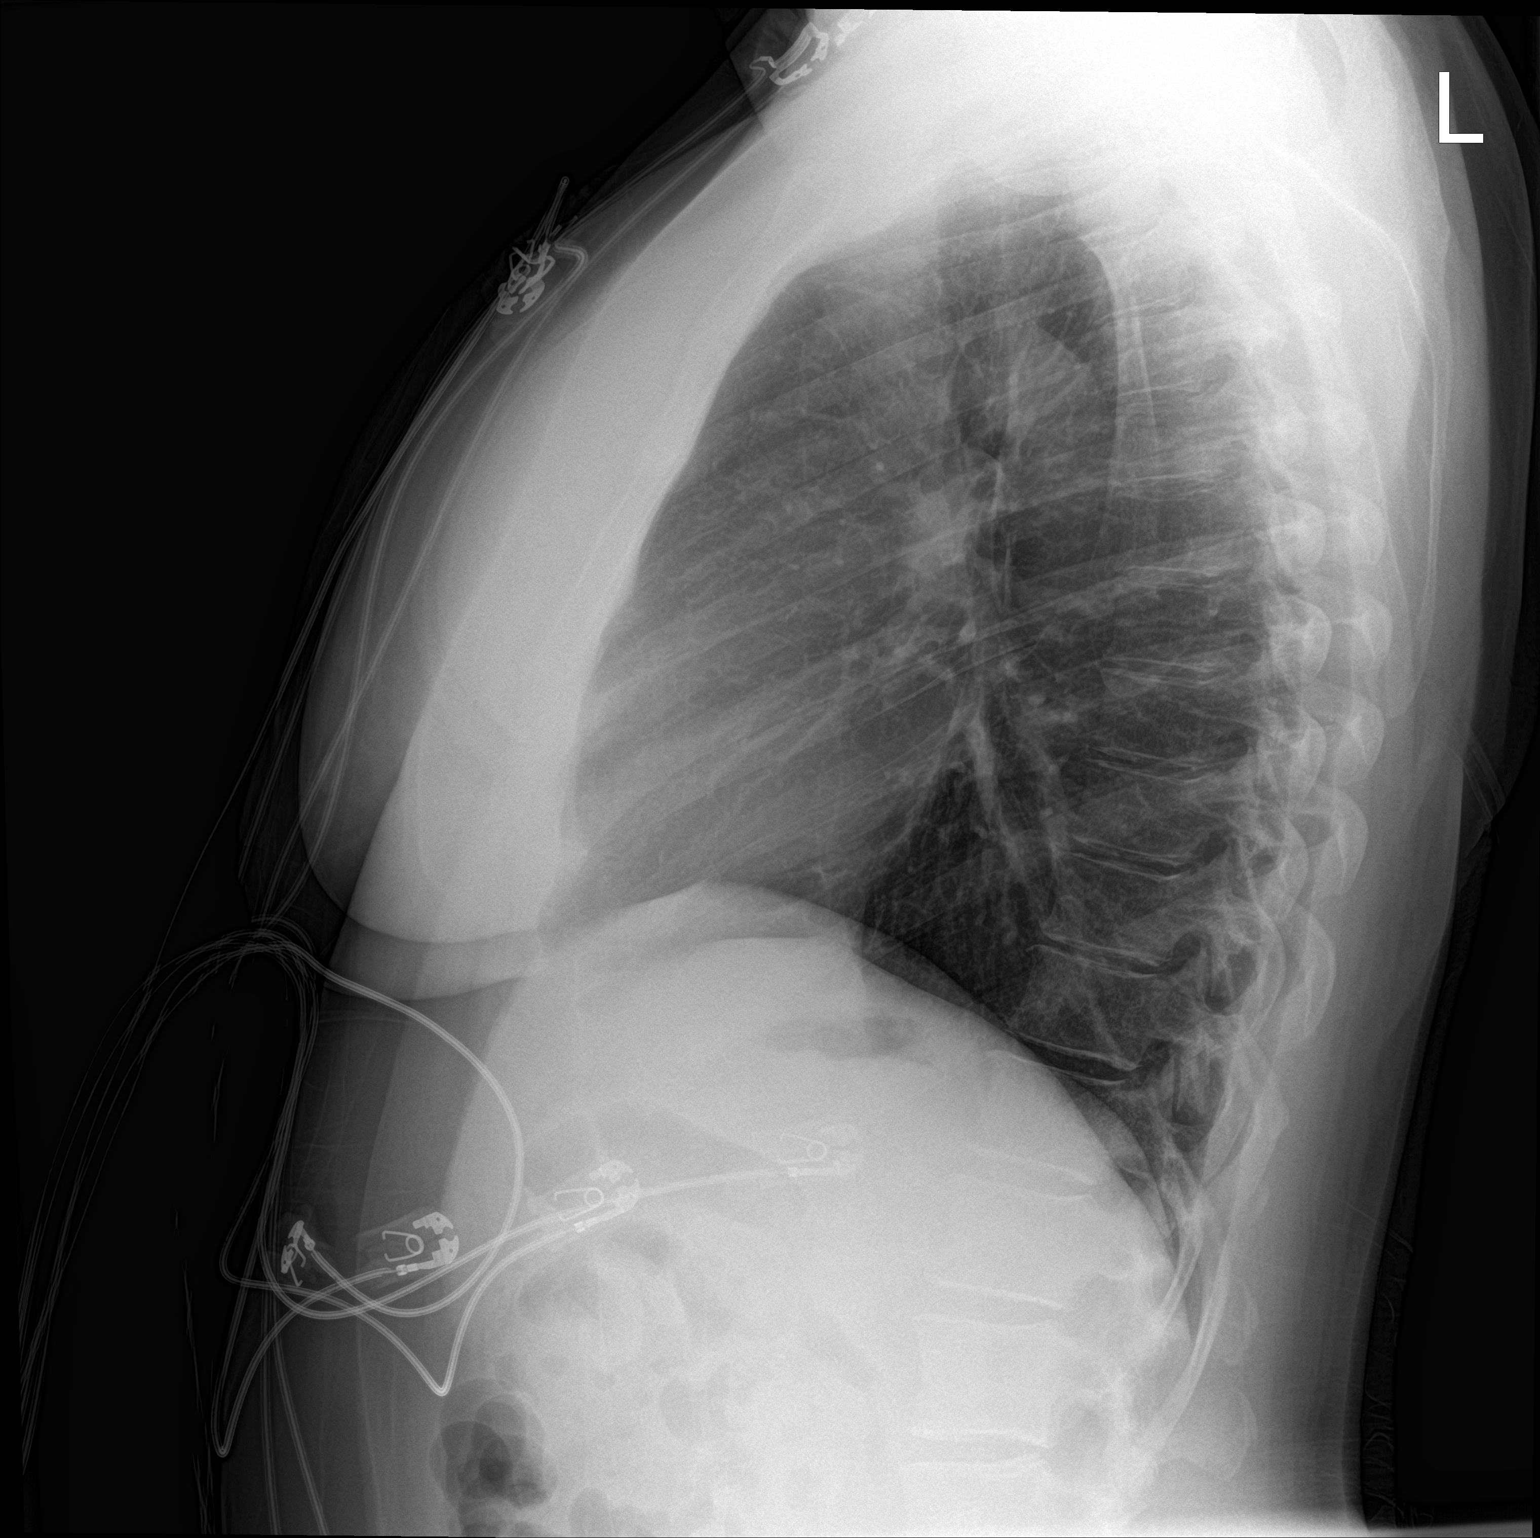

[2 of 2 positions shown; findings below may reference images not displayed]

FINDINGS: The heart size and mediastinal contours are within normal limits.
Both lungs are clear. The visualized skeletal structures are
unremarkable.
IMPRESSION: No active cardiopulmonary disease.

## 2019-09-25 ENCOUNTER — Encounter: Payer: Self-pay | Admitting: Emergency Medicine

## 2019-09-25 ENCOUNTER — Ambulatory Visit
Admission: EM | Admit: 2019-09-25 | Discharge: 2019-09-25 | Disposition: A | Discharge status: Home / Self Care | Payer: Commercial Managed Care - PPO | Attending: Family Medicine | Admitting: Family Medicine

## 2019-09-25 ENCOUNTER — Other Ambulatory Visit: Payer: Self-pay

## 2019-09-25 DIAGNOSIS — J0100 Acute maxillary sinusitis, unspecified: Secondary | ICD-10-CM | POA: Diagnosis not present

## 2019-09-25 DIAGNOSIS — J340 Abscess, furuncle and carbuncle of nose: Secondary | ICD-10-CM

## 2019-09-25 MED ORDER — DOXYCYCLINE HYCLATE 100 MG PO CAPS: 100.0000 mg | ORAL_CAPSULE | Freq: Two times a day (BID) | ORAL | 0 refills | Status: AC

## 2019-09-25 MED ORDER — MUPIROCIN 2 % EX OINT: TOPICAL_OINTMENT | CUTANEOUS | 0 refills | Status: AC

## 2019-09-25 NOTE — ED Triage Notes (Signed)
Patient in today stating that she did a virtual visit on 09/18/19 and given an antibiotic for sinus infection. There was a miscommunication re: pharmacy and patient didn't start antibiotics until Monday (09/22/19). Patient states that she isn't better and is now having bumps on her nose x 6 days. Patient states that she has been getting these "bumps" on multiple areas of her body since Oct.

## 2019-09-25 NOTE — Discharge Instructions (Addendum)
Take medication as prescribed. Rest. Drink plenty of fluids. Keep clean. Monitor.  ° °Follow up with your primary care physician this week as needed. Return to Urgent care for new or worsening concerns.  ° °

## 2019-09-25 NOTE — ED Provider Notes (Signed)
[facility] ____________________________________________  Time seen: Approximately 10:15 AM  I have reviewed the triage vital signs and the nursing notes.   HISTORY  Chief Complaint Sinusitis   HPI Monica Osborne is a 48 y.o. female presenting for evaluation of about 2 weeks of nasal congestion, sinus pressure and sinus drainage.  States that she believes she may has been recently having some allergy issues as a month ago she also had a stye and a ear infection.  Patient reports she saw her doctor and they called in an antibiotic, but reports there was a delay in her starting.  States that she has now completed 3-1/2 days of cefdinir and symptoms are continuing.  Also reports in the last day she has developed a bump to the left nostril.  States that she tried to pop it last night and since last night into today the area has become more red and swollen and had some drainage.  States the skin area to her nose is very tender and painful.  Denies any facial swelling or skin changes.  Reports postnasal drainage with occasional sore throat.  Denies fevers.  Denies cough, chest pain, shortness of breath, change in taste or smell, vomiting or diarrhea.  Denies known sick contacts.  Continues to eat and drink well.  Denies other aggravating alleviating factors.  [facility]   Past Medical History:  Diagnosis Date  . Anxiety and depression   . Pelvic pain in female     Patient Active Problem List   Diagnosis Date Noted  . Chest pain 01/19/2017  . Abnormal EKG 01/19/2017  . Sinus infection 01/19/2017  . Dyslipidemia 01/19/2017  . Shortness of breath   . Gastroesophageal reflux disease   . Endometriosis 12/03/2013  . Pelvic pain 12/02/2013    Past Surgical History:  Procedure Laterality Date  . ABDOMINAL HYSTERECTOMY    . BREAST ENHANCEMENT SURGERY    . LAPAROSCOPIC ASSISTED VAGINAL HYSTERECTOMY N/A 12/02/2013   Procedure: LAPAROSCOPIC  ASSISTED VAGINAL HYSTERECTOMY;  Surgeon: Meriel Picaichard M Holland, MD;  Location: [facility];  Service: Gynecology;  Laterality: N/A;  . LAPAROSCOPY N/A 11/24/2013   Procedure: LAPAROSCOPY DIAGNOSTIC;  Surgeon: Meriel Picaichard M Holland, MD;  [facility] ;  Service: Gynecology;  Laterality: N/A;  . LEFT HEART CATH AND CORONARY ANGIOGRAPHY N/A 02/09/2017   Procedure: Left Heart Cath and Coronary Angiography;  Surgeon: Peter M SwazilandJordan, MD;  [facility];  Service: Cardiovascular;  Laterality: N/A;  . NASAL SINUS SURGERY    . SALPINGOOPHORECTOMY Bilateral 12/02/2013   Procedure: SALPINGO OOPHORECTOMY;  Surgeon: Meriel Picaichard M Holland, MD;  Location: [facility];  Service: Gynecology;  Laterality: Bilateral;  . TUBAL LIGATION  2007     No current facility-administered medications for this encounter.   Current Outpatient Medications:  .  aspirin EC 81 MG tablet, Take 81 mg by mouth daily., Disp: , Rfl:  .  cefdinir (OMNICEF) 300 MG capsule, Take 1 capsule by mouth every 12 (twelve) hours., Disp: , Rfl:  .  doxycycline (VIBRAMYCIN) 100 MG capsule, Take 1 capsule (100 mg total) by mouth 2 (two) times daily., Disp: 20 capsule, Rfl: 0 .  fluticasone (FLONASE) 50 MCG/ACT nasal spray, Place 1 spray into both nostrils daily., Disp: , Rfl:  .  hydrochlorothiazide (MICROZIDE) 12.5 MG capsule, Take 1 capsule (12.5 mg total) by mouth daily., Disp: 30 capsule, Rfl: 3 .  metoprolol tartrate (LOPRESSOR) 25 MG tablet, Take 1 tablet (25  mg total) by mouth 2 (two) times daily., Disp: 30 tablet, Rfl: 6 .  mupirocin ointment (BACTROBAN) 2 %, Apply two times a day for 7 days., Disp: 22 g, Rfl: 0 .  pantoprazole (PROTONIX) 40 MG tablet, Take 1 tablet (40 mg total) by mouth daily at 6 (six) AM., Disp: 30 tablet, Rfl: 3  Allergies Morphine and related and Sulfa antibiotics  Family History  Problem Relation Age of Onset  . Hypertension Mother   . COPD Mother   . Heart attack Father   . Heart disease Father    . Heart failure Father   . Hypertension Father   . Heart attack Paternal Uncle   . Bladder Cancer Maternal Grandmother   . Heart attack Maternal Grandfather   . Heart disease Paternal Grandmother     Social History Social History   Tobacco Use  . Smoking status: Never Smoker  . Smokeless tobacco: Never Used  Substance Use Topics  . Alcohol use: Yes    Comment: socially  . Drug use: No    Review of Systems Constitutional: No fever ENT: As above.  Cardiovascular: Denies chest pain. Respiratory: Denies shortness of breath. Gastrointestinal: No abdominal pain.  No nausea, no vomiting.  No diarrhea.   Musculoskeletal: Negative for back pain. Skin: Positive skin changes to left nostril. Neurological: Negative for headaches, focal weakness or numbness.   ____________________________________________   PHYSICAL EXAM:  VITAL SIGNS: ED Triage Vitals  Enc Vitals Group     BP 09/25/19 0944 (!) 138/98     Pulse Rate 09/25/19 0944 79     Resp 09/25/19 0944 18     Temp 09/25/19 0944 98.1 F (36.7 C)     Temp Source 09/25/19 0944 Oral     SpO2 09/25/19 0944 100 %     Weight 09/25/19 0945 145 lb (65.8 kg)     Height 09/25/19 0945 5\' 8"  (1.727 m)     Head Circumference --      Peak Flow --      Pain Score 09/25/19 0944 6     Pain Loc --      Pain Edu? --      Excl. in GC? --     Constitutional: Alert and oriented. Well appearing and in no acute distress. Eyes: Conjunctivae are normal.  Head: Atraumatic.Mild to moderate tenderness to palpation bilateral maxillary sinuses.  No frontal sinus tenderness palpation.  No swelling. No erythema.   Ears: no erythema, normal TMs bilaterally.   Nose: nasal congestion with bilateral nasal turbinate erythema. Left lateral nare mildly erythematous with slight thickened skin and crusting, no active drainage, no visible abscess, tender to palpation, bilateral nares patent.   Mouth/Throat: Mucous membranes are moist. Oropharynx  non-erythematous.No tonsillar swelling or exudate.  Neck: No stridor.  No cervical spine tenderness to palpation. Hematological/Lymphatic/Immunilogical: No cervical lymphadenopathy. Cardiovascular: Normal rate, regular rhythm. Grossly normal heart sounds.  Good peripheral circulation. Respiratory: Normal respiratory effort.  No retractions.  No wheezes, rales or rhonchi. Good air movement.  Musculoskeletal: Steady gait. Neurologic:  Normal speech and language. No gait instability. Skin:  Skin is warm, dry and intact. No rash noted. Psychiatric: Mood and affect are normal. Speech and behavior are normal.  ___________________________________________   LABS (all labs ordered are listed, but only abnormal results are displayed)  Labs Reviewed - No data to display  PROCEDURES Procedures   INITIAL IMPRESSION / ASSESSMENT AND PLAN / ED COURSE  Pertinent labs & imaging results that were available  during my care of the patient were reviewed by me and considered in my medical decision making (see chart for details).  Well-appearing patient.  No acute distress.  Suspect maxillary sinus however also cellulitis to left nare, will stop cefdinir and start oral doxycycline.  Placed on Bactroban.  Encourage over-the-counter allergy medication.  Keeping clean.  Monitoring.  Supportive care.Discussed indication, risks and benefits of medications with patient.   Discussed follow up with Primary care physician this week. Discussed follow up and return parameters including no resolution or any worsening concerns. Patient verbalized understanding and agreed to plan.   ____________________________________________   FINAL CLINICAL IMPRESSION(S) / ED DIAGNOSES  Final diagnoses:  Acute maxillary sinusitis, recurrence not specified  Nose cellulitis     ED Discharge Orders         Ordered    doxycycline (VIBRAMYCIN) 100 MG capsule  2 times daily     09/25/19 1002    mupirocin ointment (BACTROBAN) 2 %      09/25/19 1002           Note: This dictation was prepared with Dragon dictation along with smaller phrase technology. Any transcriptional errors that result from this process are unintentional.         Marylene Land, NP 09/25/19 1023
